# Patient Record
Sex: Female | Born: 1975 | Race: Black or African American | Hispanic: No | Marital: Single | Smoking: Never smoker
Health system: Western US, Academic
[De-identification: ages and names within clinical notes are randomized; demographics above are authoritative.]

## PROBLEM LIST (undated history)

## (undated) ENCOUNTER — Ambulatory Visit (HOSPITAL_BASED_OUTPATIENT_CLINIC_OR_DEPARTMENT_OTHER): Payer: Self-pay | Admitting: Gastroenterology

## (undated) ENCOUNTER — Encounter (HOSPITAL_BASED_OUTPATIENT_CLINIC_OR_DEPARTMENT_OTHER): Payer: Self-pay

## (undated) DIAGNOSIS — I10 Essential (primary) hypertension: Secondary | ICD-10-CM

## (undated) DIAGNOSIS — R002 Palpitations: Secondary | ICD-10-CM

## (undated) DIAGNOSIS — J45909 Unspecified asthma, uncomplicated: Secondary | ICD-10-CM

## (undated) HISTORY — DX: Unspecified asthma, uncomplicated: J45.909

## (undated) HISTORY — DX: Palpitations: R00.2

## (undated) HISTORY — DX: Essential (primary) hypertension: I10

## (undated) SURGERY — COLONOSCOPY
Anesthesia: Monitored Anesthesia Care (MAC)

## (undated) MED ORDER — LIDOCAINE HCL (PF) 1 % IJ SOLN
0.50 mL | Freq: Once | INTRAMUSCULAR | Status: AC | PRN
Start: 2021-03-12 — End: 2021-03-12

## (undated) MED ORDER — GOSERELIN ACETATE 3.6 MG SC IMPL
3.60 mg | DRUG_IMPLANT | Freq: Once | SUBCUTANEOUS | Status: AC
Start: 2021-03-12 — End: 2021-03-12

## (undated) MED ORDER — TRAMADOL HCL 50 MG OR TABS
ORAL_TABLET | ORAL | Status: AC
Start: 2020-12-28 — End: ?

---

## 2020-08-28 ENCOUNTER — Other Ambulatory Visit: Payer: Self-pay | Admitting: Specialist

## 2020-08-28 DIAGNOSIS — R928 Other abnormal and inconclusive findings on diagnostic imaging of breast: Secondary | ICD-10-CM

## 2020-09-01 ENCOUNTER — Encounter (HOSPITAL_BASED_OUTPATIENT_CLINIC_OR_DEPARTMENT_OTHER): Payer: Self-pay

## 2020-09-01 ENCOUNTER — Telehealth (HOSPITAL_BASED_OUTPATIENT_CLINIC_OR_DEPARTMENT_OTHER): Payer: Self-pay

## 2020-09-01 DIAGNOSIS — C50912 Malignant neoplasm of unspecified site of left female breast: Secondary | ICD-10-CM

## 2020-09-01 NOTE — Telephone Encounter (Signed)
Received external referral :      Dx : Breast Cancer    All imaging and path included -- need images and slides     Referred by [facility] - Dr. Adella Hare  Insurance: [facility] - Medi-Cal      Will fwd to AA's for review

## 2020-09-01 NOTE — Telephone Encounter (Signed)
Imaging and path slides are requested through [facility].  Path report is requested from [facility].  Patient will be contacted to schedule consultation with Dr. Benjie Karvonen.

## 2020-09-04 NOTE — Telephone Encounter (Signed)
Patient is contacted imaging is discussed, MRI is scheduled for 8/29 at [facility] patient tried to schedule at [place] but the wait was to long.  Patient will bring her imaging on CD to consultation scheduled with Dr. Benjie Karvonen on 9/8.  Forwarding to Dr. Benjie Karvonen for left breast cancer imaging and to [place] for order to review path.

## 2020-09-04 NOTE — Telephone Encounter (Signed)
Pathology review of outside slides ordered per protocol.

## 2020-09-07 NOTE — Addendum Note (Signed)
Addended by: Tenna Delaine on: 09/07/2020 10:48 AM     Modules accepted: Orders

## 2020-09-16 NOTE — Interdisciplinary (Signed)
New Patient Consultation with Dr. Benjie Karvonen scheduled 09/17/20 for [facility]     Diagnosis   Left Breast ILC    ER/PR/HER2 Status: ER/PR +, HER2 -, Ki-67 11%    Pathology  07/23/20 ([facility])      Imaging  06/25/20 Dx Mammo and U/S ([place])        Nursing Assessment  Pt arrives alert and orientated, accompanied by daughter  Patient reports the following breast symptoms: felt lump in June     History  Menarche: 12  Menopause: Lmp 09/03/20  G5P3 2 AB-- age at first birth: 20  HRT/OCP: OCP x 10 yrs  Family History of Breast [place]: denies  Family History of Ovarian YY:TKPTWS  Genetic testing: denies  Ashkenazi Jewish heritage: denies  Prior breast biopsies/abnormal imaging (laterality and date):     Family History of Cancer:   No family history on file.    Medical History   Past Medical History:   Diagnosis Date   . Asthma    . Hypertension         Surgical History   No past surgical history on file.    Social history  Social History     Socioeconomic History   . Marital status: Single   Tobacco Use   . Smoking status: Never Smoker   . Smokeless tobacco: Never Used   Substance and Sexual Activity   . Alcohol use: Yes     Alcohol/week: 2.0 standard drinks     Types: 2 Glasses of wine per week     Comment: once a week   . Drug use: Never       Home life: lives with fiance and son  Transportation: pt drives, daughter can provide transportation  Employment: caregiver    Medications and Allergies:   Reviewed in [place], U/S and Breast MRI   - labs    Return to clinic with Dr. Benjie Karvonen in 2 weeks    Patient provided with AVS, which was reviewed thoroughly prior to discharge.

## 2020-09-16 NOTE — Patient Instructions (Signed)
[facility]  PHONE LIST FOR PATIENTS    Hours of operation:  Monday-Friday 8:00 - 4:30pm. Closed Holidays and Weekends    AFTER HOURS EMERGENCY NUMBER:    For urgent questions after hours, or on weekends/holidays, contact the [place] paging operator at [phone], and ask to speak to the doctor on call for Dr. Sarah Blair. Ask for On-Call Medical Oncologist for medical symptoms    [facility] front desk at: [phone]    For surgery scheduling, insurance or disability questions, please call,  Administrative Assistant Lori Morgan:Phone: [phone], Fax: [phone]    For nursing questions during weekdays, please call:   Jaquelynn Wanamaker , RN, BSN, Nurse Case Manager at [phone]    Social Worker:   Brooke Mullineaux, LCSW - Phone: [phone] or Laurie Knight, BS, MSW, LCSW  - Phone: [phone]    [facility] Useful Phone Numbers     Imaging Scheduling Number: [phone]     Nuclear Medicine Bone Scan: [phone]     Scheduling Line (medical oncology, plastic surgery and breast surgery): [phone]     [place] [facility]: [phone]     [place] [facility] On[facility]: [phone] or [phone]    [place] [facility] On[facility]: [phone]     [place] [facility] On[facility]: [phone]      Occupational Therapy: [phone]     Gynecology Oncology: [phone]     Family Center Genetics: [phone]     Plastic Surgery: [phone]    Information Desk: [phone]    General information, directions, phone numbers, registration and other available services     Financial Counselors: [phone] at [facility] [phone] or [phone]   Marisela, MCC Financial Counselor: [phone] [email or web address]    Insurance Contracting: [phone]   MED CENTER Billing: [phone]   MED GROUP (Physician) Billing: [phone]       Helpful Resources      Patient and Family Resource Center - Open daily; hours vary   Location: [facility], Room 1066, on the first floor just to the left of the lobby elevators   Phone [phone]     Kaiser's support groups (open to any patients in [place] area:) http://[email or web address]_Home.[email or web address]      American Cancer Society Breast [place] support groups: https://www.cancer.org/treatment/support-programs-and-services.html       Stage IV Zoom Breast Cancer Support group meets twice monthly. Sign up with the following link: [email or web address]?trumbaEmbed=filterview%3Dcancerdefault%26template%3Dlist     Use this link to learn more about breast cancer as a diagnosis.   http://www.losolivos-obgyn.com/info/general_health/breast_care/guide_breast_ca_dx_tx.pdf     Use this link to learn more about advance directives.  https://prepareforyourcare.org/content/default/common/documents/PREPARE-Pamphlet-Flat-English.pdf       Tell Us How We Did During Your Consultation/Follow Up Visit...     It is our priority to make sure that we are providing excellent patient care.    Your feedback goes a long way and makes a difference. If you would like to provide us feedback on how we did, you can contact the Patient Experience 'We Listen' Department via telephone, Email, or by mail. Below you will find the contact information for Patient Experience 'We Listen'     E-mail: [email or web address]   Phone: [phone]    Mail:   Patient Experience   [facility]    [address], Mail [address]   [place] [address]        Thank you for the opportunity to care for you during this time.

## 2020-09-17 ENCOUNTER — Ambulatory Visit: Payer: Medicaid Other | Attending: Surgical Oncology | Admitting: Surgical Oncology

## 2020-09-17 ENCOUNTER — Other Ambulatory Visit (INDEPENDENT_AMBULATORY_CARE_PROVIDER_SITE_OTHER): Payer: Medicaid Other | Attending: Surgical Oncology

## 2020-09-17 ENCOUNTER — Encounter (HOSPITAL_BASED_OUTPATIENT_CLINIC_OR_DEPARTMENT_OTHER): Payer: Self-pay | Admitting: Surgical Oncology

## 2020-09-17 VITALS — BP 122/80 | HR 98 | Temp 97.2°F | Resp 16 | Ht 67.0 in | Wt 159.6 lb

## 2020-09-17 DIAGNOSIS — Z170 Estrogen receptor positive status [ER+]: Secondary | ICD-10-CM | POA: Insufficient documentation

## 2020-09-17 DIAGNOSIS — C50412 Malignant neoplasm of upper-outer quadrant of left female breast: Secondary | ICD-10-CM | POA: Insufficient documentation

## 2020-09-17 LAB — CBC WITH DIFF, BLOOD
ANC-Automated: 5.2 10*3/uL (ref 1.6–7.0)
ANC-Instrument: 5.2 10*3/uL (ref 1.6–7.0)
Abs Basophils: 0.1 10*3/uL (ref <0.2)
Abs Eosinophils: 0.5 10*3/uL (ref 0.0–0.5)
Abs Lymphs: 3.7 10*3/uL — ABNORMAL HIGH (ref 0.8–3.1)
Abs Monos: 1.3 10*3/uL — ABNORMAL HIGH (ref 0.2–0.8)
Basophils: 1 %
Eosinophils: 5 %
Hct: 40.8 % (ref 34.0–45.0)
Hgb: 14.4 gm/dL (ref 11.2–15.7)
Lymphocytes: 34 %
MCH: 33.9 pg — ABNORMAL HIGH (ref 26.0–32.0)
MCHC: 35.3 g/dL (ref 32.0–36.0)
MCV: 96 um3 — ABNORMAL HIGH (ref 79.0–95.0)
MPV: 9.7 fL (ref 9.4–12.4)
Monocytes: 12 %
Plt Count: 327 10*3/uL (ref 140–370)
RBC: 4.25 10*6/uL (ref 3.90–5.20)
RDW: 12.2 % (ref 12.0–14.0)
Segs: 47 %
WBC: 10.9 10*3/uL — ABNORMAL HIGH (ref 4.0–10.0)

## 2020-09-17 LAB — BASIC METABOLIC PANEL, BLOOD
Anion Gap: 13 mmol/L (ref 7–15)
BUN: 14 mg/dL (ref 6–20)
Bicarbonate: 28 mmol/L (ref 22–29)
Calcium: 9.7 mg/dL (ref 8.5–10.6)
Chloride: 99 mmol/L (ref 98–107)
Creatinine: 0.88 mg/dL (ref 0.51–0.95)
GFR: >60 mL/min
Glucose: 91 mg/dL (ref 70–99)
Potassium: 3.5 mmol/L (ref 3.5–5.1)
Sodium: 140 mmol/L (ref 136–145)
eGFR Based on CKD-EPI 2021 Equation: >60 mL/min

## 2020-09-17 LAB — RBC MORPHOLOGY: Plt Est: ADEQUATE

## 2020-09-17 MED ORDER — HYDROCHLOROTHIAZIDE 25 MG OR TABS: 25.0000 mg | ORAL_TABLET | Freq: Every day | ORAL | Status: AC

## 2020-09-17 MED ORDER — MONTELUKAST SODIUM 10 MG OR TABS
ORAL_TABLET | Freq: Every evening | ORAL | Status: AC
Start: 2020-08-15 — End: ?

## 2020-09-17 MED ORDER — ALBUTEROL SULFATE 108 (90 BASE) MCG/ACT IN AERS
INHALATION_SPRAY | RESPIRATORY_TRACT | Status: AC
Start: 2020-08-18 — End: ?

## 2020-09-17 NOTE — Goals of Care (Signed)
Advance Care Planning    What gives the patient's life meaning?      Patient would be willing to endure aggressive medical therapies as long as they could still:       Who would make medical decisions for the patient if they are unable to make decisions for themselves?   Shari Prows daughter [phone]    Based on above information I recommended the following:  completion of an advance directive and reviewing www.prepareforyourcare.com    Total time spent face-to-face with patient and/or surrogate decision maker providing counseling related to advance care planning:   2 minutes

## 2020-09-17 NOTE — Progress Notes (Signed)
Surgical Oncology:        Demographics:  Date: September 17, 2020   Patient Name: Kelsey Rubio   Medical Record #: [id number]   DOB: [birth year]  Age: 45 year old  Sex: female    Interval History:    Kelsey Rubio is 45 year old female  with T2N0 ILC.  She presented with a palpable mass.  Imaging showed T2 mass core bx ILC.  Last mammogram 2 yrs ago was normal.  No fhx breast [place].  She declined genetic testing    ER/PR/HER2 Status: ER/PR +, HER2 -, Ki-67 11%    Pathology  07/23/20 ([facility])      Imaging  06/25/20 Dx Mammo and U/S ([place])          History  Menarche: 12  Menopause: Lmp 09/03/20  G5P3 2 AB-- age at first birth: 66  HRT/OCP: OCP x 10 yrs  Family History of Breast [place]: denies  Family History of Ovarian QQ:PYPPJK  Genetic testing: denies  Ashkenazi Jewish heritage: denies  Prior breast biopsies/abnormal imaging (laterality and date):         Surgical Oncologic History:    Stage at first diagnosis: lt T2N0 9/22  Surgical Procedure: p  Chemotherapy: p  Radiation: p    REVIEW OF SYSTEMS   GEN: The patient has no weight loss, fevers, or chills  EYES:No change in vision.  ENT: No change in hearing. No epistaxis.   PULM: No dypnea, productive cough, or wheezing  CARDIO:  No chest pain, tachycardias, dyspnea on exertion.  GI: No jaundice, hematesesis, abdominal pain, diarrhea or constipation.  GU: No dysuria or hematuria  ENDO: No diabetes.  JOINT:  No new joint pains.  HEME/LYMPHATIC: No bleeding, anemias, or lymphadenopathy.  NEURO: No loss of consciousness, headaches.    Medications:  Current Outpatient Medications   Medication Sig    albuterol 108 (90 Base) MCG/ACT inhaler INHALE 2 PUFFS BY MOUTH EVERY 4 TO 6 HOURS AS NEEDED    hydrochlorothiazide (HYDRODIURIL) 25 MG tablet Take 25 mg by mouth daily.    montelukast (SINGULAIR) 10 MG tablet TAKE 1 TABLET BY MOUTH EVERY DAY IN THE EVENING     No current facility-administered medications for this visit.        Allergies:  Allergies   Allergen Reactions    Aspirin Other     Wheezing and stuffy nose    Ibuprofen Other     Wheezing and stuffy nose         Physical Exam:  BP 122/80    Pulse 98    Temp 97.2 F (36.2 C) (Temporal)    Resp 16    Ht '5\' 7"'  (1.702 m)    Wt 72.4 kg (159 lb 9.8 oz)    SpO2 96%    BMI 25.00 kg/m    General Appearance: The patient is an alert, cooperative female in no acute distress.  SKIN: No lesions  HEENT: PERRLA, EOMs are intact.  The orophynx is clear.   LYMPH NODES: No palpable supra clavicular nodes.  CHEST: The lungs are clear to auscultation.  BREAST: large breast bilateral rt breast no masses no ln, lt breast 2cm mass UOQ no ln palp  HEART: S1 and S2 are normal.  There are no murmurs or extrasounds.  ABDOMEN:  Liver and spleen are not palpable.  EXTREMITES: There is no edema or cyanosis. Joints are normal without redness or swelling.  NEURO:  Mental status is normal.  No focal neurologic findings.    Assessment/Plan:  In summary this patient is a 45 year old female with T2N0 ILC  1. Imaging at [facility] including breast mri for extent of disease  2. Pt is thinking about breast reduction  3. F/u after imaging for surgical planning    Greater than 60 minutes was spent on this encounter with >80% of the visit in face to face counseling, reviewing imaging, history, and exam as well as surgical planning and complex care coordination. Diagnoses, natural course, treatments and risks were discussed with the patient. Patient verbalizes understanding and is agreeable to above plan.

## 2020-09-18 ENCOUNTER — Other Ambulatory Visit
Admission: RE | Admit: 2020-09-18 | Discharge: 2020-09-18 | Disposition: A | Discharge status: Home / Self Care | Payer: Medicaid Other | Attending: Anatomic Pathology & Clinical Pathology | Admitting: Anatomic Pathology & Clinical Pathology

## 2020-09-18 DIAGNOSIS — Z170 Estrogen receptor positive status [ER+]: Secondary | ICD-10-CM

## 2020-09-18 DIAGNOSIS — C50912 Malignant neoplasm of unspecified site of left female breast: Secondary | ICD-10-CM | POA: Insufficient documentation

## 2020-09-22 ENCOUNTER — Other Ambulatory Visit: Payer: Self-pay

## 2020-09-23 ENCOUNTER — Telehealth (HOSPITAL_BASED_OUTPATIENT_CLINIC_OR_DEPARTMENT_OTHER): Payer: Self-pay | Admitting: Surgical Oncology

## 2020-09-23 NOTE — Telephone Encounter (Signed)
Patients fiance came to the front desk to drop off cd's for Dr.Blair. Patient has an appointment next week so she will pick cd's then. I will place cd's on [name]'s desk.

## 2020-09-28 NOTE — Patient Instructions (Signed)
[facility]  PHONE LIST FOR PATIENTS    Hours of operation:  Monday-Friday 8:00 - 4:30pm. Closed Holidays and Weekends    AFTER HOURS EMERGENCY NUMBER:    For urgent questions after hours, or on weekends/holidays, contact the [place] paging operator at [phone], and ask to speak to the doctor on call for Dr. Sarah Blair. Ask for On-Call Medical Oncologist for medical symptoms    [facility] front desk at: [phone]    For surgery scheduling, insurance or disability questions, please call,  Administrative Assistant Lori Morgan:Phone: [phone], Fax: [phone]    For nursing questions during weekdays, please call:   [name] Gudoy , RN, BSN, Nurse Case Manager at [phone]    Social Worker:   Brooke Mullineaux, LCSW - Phone: [phone] or Laurie Knight, BS, MSW, LCSW  - Phone: [phone]    [facility] Useful Phone Numbers     Imaging Scheduling Number: [phone]     Nuclear Medicine Bone Scan: [phone]     Scheduling Line (medical oncology, plastic surgery and breast surgery): [phone]     [place] [facility]: [phone]     [place] [facility] On[facility]: [phone] or [phone]    [place] [facility] Oncology: [phone]     [place] [facility]: [phone]      Occupational Therapy: [phone]     Gynecology Oncology: [phone]     Family Center Genetics: [phone]     Plastic Surgery: [phone]    Information Desk: [phone]    General information, directions, phone numbers, registration and other available services     Financial Counselors: [phone] at [facility] [phone] or [phone]   Marisela, MCC Financial Counselor: [phone] [email or web address]    Insurance Contracting: [phone]   MED CENTER Billing: [phone]   MED GROUP (Physician) Billing: [phone]       Helpful Resources      Patient and Family Resource Center - Open daily; hours vary   Location:  Cancer Center, Room 1066, on the first floor just to the left of the lobby elevators   Phone [phone]     Kaiser's support groups (open to any patients in [place] area:) http://[email or web address]/Support_Home.html      American Cancer Society Breast [place] support groups: https://www.cancer.org/treatment/support-programs-and-services.html       Stage IV Zoom Breast Cancer Support group meets twice monthly. Sign up with the following link: Https://[email or web address]/patients/[email or web address]/calendar/Pages/default.aspx?trumbaEmbed=filterview%3Dcancerdefault%26template%3Dlist     Use this link to learn more about breast cancer as a diagnosis.   http://www.losolivos-obgyn.com/info/general_health/breast_care/guide_breast_ca_dx_tx.pdf     Use this link to learn more about advance directives.  https://prepareforyourcare.org/content/default/common/documents/PREPARE-Pamphlet-Flat-English.pdf       Tell Us How We Did During Your Consultation/Follow Up Visit...     It is our priority to make sure that we are providing excellent patient care.    Your feedback goes a long way and makes a difference. If you would like to provide us feedback on how we did, you can contact the Patient Experience 'We Listen' Department via telephone, Email, or by mail. Below you will find the contact information for Patient Experience 'We Listen'     E-mail: [email or web address]   Phone: [phone]    Mail:   Patient Experience   [facility]    [address], Mail Code 8916    [address]        Thank you for the opportunity to care for you during this time.

## 2020-09-28 NOTE — Interdisciplinary (Deleted)
45 year old patient with breast cancer here for surgical planning with Dr Benjie Karvonen    Pre-op diagnosis  Left Breast ILC    Neoadjuvant treatment  *** Last dose:    Pre-op imaging   Mammo/US: ***  MRI: ***    Nursing assessment  Patient ***unaccompanied by***  Alert and oriented x 4    Wellbeing Assessment :   Transportation issue: None  Barriers to learning: None.  Emotional support given     Interventions:  Supportive care given. Personal difficulties related to treatment addressed to patient's satisfaction. Patient/family concerns addressed.     Education:  Contact information, AVS given and reviewed. Questions asked and answered to patient's satisfaction and understanding      Plan  -Pt. Consented for ***  -surgery as scheduled  -post-op as scheduled

## 2020-09-30 ENCOUNTER — Inpatient Hospital Stay (INDEPENDENT_AMBULATORY_CARE_PROVIDER_SITE_OTHER): Admit: 2020-09-30 | Discharge: 2020-09-30 | Disposition: A | Discharge status: Home Health | Payer: Medicaid Other

## 2020-09-30 ENCOUNTER — Telehealth (HOSPITAL_BASED_OUTPATIENT_CLINIC_OR_DEPARTMENT_OTHER): Payer: Self-pay

## 2020-09-30 DIAGNOSIS — R928 Other abnormal and inconclusive findings on diagnostic imaging of breast: Secondary | ICD-10-CM

## 2020-09-30 MED ORDER — GADOBUTROL 1 MMOL/ML IV SOLN (WRAPPED RECORD)
7.0000 mL | Freq: Once | INTRAVENOUS | Status: AC
Start: 2020-09-30 — End: 2020-09-30
  Administered 2020-09-30 (×2): 7 mL via INTRAVENOUS

## 2020-09-30 NOTE — Telephone Encounter (Signed)
Spoke with pt and let her know that she still needs to get a repeat mammo and US done before her f/u visit with Dr Benjie Karvonen and that these imaging's were scheduled on 10/07/20 at 10:20 am, checkin 15 min prior at [facility]. Also informed her will reschedule her 9/22 appt, she is able to come in on Thursday 9/29 at 11 am.

## 2020-10-01 ENCOUNTER — Ambulatory Visit (HOSPITAL_BASED_OUTPATIENT_CLINIC_OR_DEPARTMENT_OTHER): Payer: Medicaid Other | Admitting: Surgical Oncology

## 2020-10-01 NOTE — Interdisciplinary (Signed)
45 year old patient with breast cancer here for surgical planning with Dr Benjie Karvonen    Pre-op diagnosis  Left Breast ILC    Neoadjuvant treatment   Last dose:    Pre-op imaging   Mammo/US: 10/07/20  DIAGNOSTIC MAMMOGRAM WITH DIGITAL BREAST TOMOSYNTHESIS FINDINGS:  The breasts are heterogeneously dense, which may obscure small masses.    In the right breast there are no masses, asymmetries, or suspicious calcifications.    There is an irregular mass with spiculated margins and associated marker clip in the left breast at 2 o'clock located 9 centimeters from the nipple.    ULTRASOUND FINDINGS:  Sonography was performed in the left breast in transverse and longitudinal planes. Ultrasound demonstrates an irregular mass measuring 2.0 x 1.3 x 2.0 cm in the left breast at 2 o'clock located 9 centimeters from the nipple.  The mass is poorly delineated on mammography and ultrasound, and is known to be substantially larger than these ultrasound measurements based on 09/30/2020 MRI.    No axillary lymphadenopathy identified.    IMPRESSION / RECOMMENDATION:  There is no evidence of malignancy in the right breast.    Mass in the left breast is a known malignancy.    Clinical management of known malignancy is recommended.    ASSESSMENT:  BI-RADS Category 6:  Known Biopsy Proven Malignancy    MRI: 09/30/20  FINDINGS:  The technical quality of this study is considered adequate to make a final assessment and recommendation.    There is heterogeneous fibroglandular tissue bilaterally and moderate background enhancement.    Coronal T2 sequences demonstrates normal bilateral axillary nodes.    Axial T2 sequence demonstrates no large cysts or fluid collections.    Non fat saturated T1 sequence demonstrates no fat containing lesions. Signal void in the left breast at site of biopsy marking clip seen on mammogram.    High resolution post contrast sequence demonstrates no internal mammary lymphadenopathy.    Regarding the right  breast: There are no masses, architectural distortion or suspicious areas of enhancement.    Regarding the left breast: Known invasive lobular carcinoma in the left breast is seen as an irregular mass with spiculated and associated focal non-mass enhancement at the 2 o'clock position, 9 cm from the nipple measuring 6.2 x 3.7 x 2.3 cm (post contrast dynamic image 102). Internal enhancement is heterogeneous. Kinetic assessment demonstrates fast initial enhancement followed by washout in the delayed portions of the curve.    CONCURRENT SUPERVISION:   I have reviewed the images and agree with the fellow's interpretation.     Preliminary created by: Teressa Lower   Signed by: Lynnda Child 09/30/2020 16:10:36  IMPRESSION:  IMPRESSION:  1.Known invasive lobular carcinoma in the left breast at 2:00 measures 6.2 x 3.7 x 2.3 cm.    2. No lymphadenopathy.    Recommendations: Diagnostic mammogram and left breast ultrasound are scheduled for 10/07/2020.    BI-RADS category 1, negative right breast.    BI-RADS category 6, known biopsy proven malignancy left breast.      Nursing assessment  Patient unaccompanied  Alert and oriented x 4    Wellbeing Assessment :   Transportation issue: None  Barriers to learning: None.  Emotional support given     Interventions:  Supportive care given. Personal difficulties related to treatment addressed to patient's satisfaction. Patient/family concerns addressed.     Education:  Contact information, AVS given and reviewed. Questions asked and answered to patient's satisfaction and understanding  Plan  -Pt. Consented for Left Breast Image Guided Lumpectomy and Sentinel Lymph Node Biopsy by NCM [name]    -surgery as scheduled  -post-op as scheduled

## 2020-10-01 NOTE — Patient Instructions (Signed)
[facility]  PHONE LIST FOR PATIENTS    Hours of operation:  Monday-Friday 8:00 - 4:30pm. Closed Holidays and Weekends    AFTER HOURS EMERGENCY NUMBER:    For urgent questions after hours, or on weekends/holidays, contact the [place] paging operator at [phone], and ask to speak to the doctor on call for Dr. Sarah Blair. Ask for On-Call Medical Oncologist for medical symptoms    [facility] front desk at: [phone]    For surgery scheduling, insurance or disability questions, please call,  Administrative Assistant Lori Morgan:Phone: [phone], Fax: [phone]    For nursing questions during weekdays, please call:   [name] Gudoy , RN, BSN, Nurse Case Manager at [phone]    Social Worker:   Brooke Mullineaux, LCSW - Phone: [phone] or Laurie Knight, BS, MSW, LCSW  - Phone: [phone]    [facility] Useful Phone Numbers     Imaging Scheduling Number: [phone]     Nuclear Medicine Bone Scan: [phone]     Scheduling Line (medical oncology, plastic surgery and breast surgery): [phone]     [place] [facility]: [phone]     [place] [facility]: [phone] or [phone]    [place] [facility] On[facility]: [phone]     [place] [facility] On[facility]: [phone]      Occupational Therapy: [phone]     Gynecology Oncology: [phone]     Family Center Genetics: [phone]     Plastic Surgery: [phone]    Information Desk: [phone]    General information, directions, phone numbers, registration and other available services     Financial Counselors: [phone] at [facility] [phone] or [phone]   Marisela, MCC Financial Counselor: [phone] [email or web address]    Insurance Contracting: [phone]   MED CENTER Billing: [phone]   MED GROUP (Physician) Billing: [phone]       Helpful Resources      Patient and Family Resource Center - Open daily; hours vary   Location: [facility], Room 1066, on the first floor just to the left of the lobby elevators   Phone [phone]     Kaiser's support groups (open to any patients in [place] area:) http://[email or web address]-[place].[email or web address]/Support_Home.html      American Cancer Society Breast [place] support groups: https://www.cancer.org/treatment/support-programs-and-services.html       Stage IV Zoom Breast Cancer Support group meets twice monthly. Sign up with the following link: [email or web address]://[email or web address]/Pages/default.aspx?trumbaEmbed=filterview%3Dcancerdefault%26template%3Dlist     Use this link to learn more about breast cancer as a diagnosis.   http://www.losolivos-obgyn.com/info/general_health/breast_care/guide_breast_ca_dx_tx.pdf     Use this link to learn more about advance directives.  https://prepareforyourcare.org/content/default/common/documents/PREPARE-Pamphlet-Flat-English.pdf       Tell Us How We Did During Your Consultation/Follow Up Visit...     It is our priority to make sure that we are providing excellent patient care.    Your feedback goes a long way and makes a difference. If you would like to provide us feedback on how we did, you can contact the Patient Experience 'We Listen' Department via telephone, Email, or by mail. Below you will find the contact information for Patient Experience 'We Listen'     E-mail: [email or web address]   Phone: [phone]    Mail:   Patient Experience   [facility]    [address], Mail [address] 8916    [address]        Thank you for the opportunity to care for you during this time.

## 2020-10-02 LAB — FISH: HER-2NEU AMPLIFICATION ASSAY

## 2020-10-07 ENCOUNTER — Ambulatory Visit (HOSPITAL_BASED_OUTPATIENT_CLINIC_OR_DEPARTMENT_OTHER)
Admit: 2020-10-07 | Discharge: 2020-10-07 | Disposition: A | Discharge status: Home Health | Payer: Medicaid Other | Attending: Surgical Oncology | Admitting: Surgical Oncology

## 2020-10-07 ENCOUNTER — Ambulatory Visit
Admission: RE | Admit: 2020-10-07 | Discharge: 2020-10-07 | Disposition: A | Discharge status: Home / Self Care | Payer: Medicaid Other | Attending: Surgical Oncology | Admitting: Surgical Oncology

## 2020-10-07 DIAGNOSIS — C50412 Malignant neoplasm of upper-outer quadrant of left female breast: Secondary | ICD-10-CM

## 2020-10-07 DIAGNOSIS — Z170 Estrogen receptor positive status [ER+]: Secondary | ICD-10-CM

## 2020-10-07 DIAGNOSIS — C50912 Malignant neoplasm of unspecified site of left female breast: Secondary | ICD-10-CM

## 2020-10-07 DIAGNOSIS — Z978 Presence of other specified devices: Secondary | ICD-10-CM

## 2020-10-08 ENCOUNTER — Ambulatory Visit: Payer: Medicaid Other | Attending: Surgical Oncology | Admitting: Surgical Oncology

## 2020-10-08 ENCOUNTER — Other Ambulatory Visit (HOSPITAL_BASED_OUTPATIENT_CLINIC_OR_DEPARTMENT_OTHER): Payer: Self-pay | Admitting: Surgical Oncology

## 2020-10-08 ENCOUNTER — Encounter (HOSPITAL_BASED_OUTPATIENT_CLINIC_OR_DEPARTMENT_OTHER): Payer: Self-pay | Admitting: Surgical Oncology

## 2020-10-08 VITALS — BP 143/88 | HR 76 | Temp 96.8°F | Resp 18 | Ht 67.0 in | Wt 164.7 lb

## 2020-10-08 DIAGNOSIS — C50412 Malignant neoplasm of upper-outer quadrant of left female breast: Secondary | ICD-10-CM

## 2020-10-08 DIAGNOSIS — C50919 Malignant neoplasm of unspecified site of unspecified female breast: Secondary | ICD-10-CM

## 2020-10-08 DIAGNOSIS — Z170 Estrogen receptor positive status [ER+]: Secondary | ICD-10-CM

## 2020-10-08 DIAGNOSIS — R928 Other abnormal and inconclusive findings on diagnostic imaging of breast: Secondary | ICD-10-CM

## 2020-10-08 LAB — EMMI , LUMPECTOMY FOR MALIGNANCY: EMMI Video Order Number: 18098216403

## 2020-10-08 NOTE — Interdisciplinary (Signed)
45 year old patient with breast cancer here for surgical planning with Dr Benjie Karvonen    Pre-op diagnosis  Left Breast ILC    Neoadjuvant treatment   Last dose:    Pre-op imaging   Mammo/US: 10/07/20  DIAGNOSTIC MAMMOGRAM WITH DIGITAL BREAST TOMOSYNTHESIS FINDINGS:  The breasts are heterogeneously dense, which may obscure small masses.    In the right breast there are no masses, asymmetries, or suspicious calcifications.    There is an irregular mass with spiculated margins and associated marker clip in the left breast at 2 o'clock located 9 centimeters from the nipple.    ULTRASOUND FINDINGS:  Sonography was performed in the left breast in transverse and longitudinal planes. Ultrasound demonstrates an irregular mass measuring 2.0 x 1.3 x 2.0 cm in the left breast at 2 o'clock located 9 centimeters from the nipple.  The mass is poorly delineated on mammography and ultrasound, and is known to be substantially larger than these ultrasound measurements based on 09/30/2020 MRI.    No axillary lymphadenopathy identified.    IMPRESSION / RECOMMENDATION:  There is no evidence of malignancy in the right breast.    Mass in the left breast is a known malignancy.    Clinical management of known malignancy is recommended.    ASSESSMENT:  BI-RADS Category 6:  Known Biopsy Proven Malignancy    MRI: 09/30/20  FINDINGS:  The technical quality of this study is considered adequate to make a final assessment and recommendation.    There is heterogeneous fibroglandular tissue bilaterally and moderate background enhancement.    Coronal T2 sequences demonstrates normal bilateral axillary nodes.    Axial T2 sequence demonstrates no large cysts or fluid collections.    Non fat saturated T1 sequence demonstrates no fat containing lesions. Signal void in the left breast at site of biopsy marking clip seen on mammogram.    High resolution post contrast sequence demonstrates no internal mammary lymphadenopathy.    Regarding the right  breast: There are no masses, architectural distortion or suspicious areas of enhancement.    Regarding the left breast: Known invasive lobular carcinoma in the left breast is seen as an irregular mass with spiculated and associated focal non-mass enhancement at the 2 o'clock position, 9 cm from the nipple measuring 6.2 x 3.7 x 2.3 cm (post contrast dynamic image 102). Internal enhancement is heterogeneous. Kinetic assessment demonstrates fast initial enhancement followed by washout in the delayed portions of the curve.    CONCURRENT SUPERVISION:   I have reviewed the images and agree with the fellow's interpretation.     Preliminary created by: Teressa Lower   Signed by: Lynnda Child 09/30/2020 16:10:36  IMPRESSION:  IMPRESSION:  1.Known invasive lobular carcinoma in the left breast at 2:00 measures 6.2 x 3.7 x 2.3 cm.    2. No lymphadenopathy.    Recommendations: Diagnostic mammogram and left breast ultrasound are scheduled for 10/07/2020.    BI-RADS category 1, negative right breast.    BI-RADS category 6, known biopsy proven malignancy left breast.      Nursing assessment  Patient seen and assessed by LVN Claudia    Wellbeing Assessment :   Transportation issue: None  Barriers to learning: None.  Emotional support given     Interventions:  Supportive care given. Personal difficulties related to treatment addressed to patient's satisfaction. Patient/family concerns addressed.     Education:  Contact information, AVS given and reviewed. Questions asked and answered to patient's satisfaction and understanding  Plan  -Pt. Consented for Left Breast Image Guided Lumpectomy and Sentinel Lymph Node Biopsy by  -surgery as scheduled  -post-op as scheduled  -surgical instructions given  - needs MR guided localization

## 2020-10-08 NOTE — Progress Notes (Signed)
Surgical Oncology:        Demographics:  Date: October 08, 2020   Patient Name: Kelsey Rubio   Medical Record #: [id number]   DOB: [birth year]  Age: 45 year old  Sex: female    Interval History:    Kelsey Rubio is 45 year old female  with T2N0 ILC.  She presented with a palpable mass.  Imaging showed T2 mass core bx ILC.  Last mammogram 2 yrs ago was normal.  No fhx breast Ideal.  She declined genetic testing.  Breast mri shows 6cm disease    Breast mri  IMPRESSION:  1.Known invasive lobular carcinoma in the left breast at 2:00 measures 6.2 x 3.7 x 2.3 cm.    2. No lymphadenopathy.    Recommendations: Diagnostic mammogram and left breast ultrasound are scheduled for 10/07/2020.    BI-RADS category 1, negative right breast.    BI-RADS category 6, known biopsy proven malignancy left breast.      ER/PR/HER2 Status: ER/PR +, HER2 -, Ki-67 11%    [facility]  [facility]      Imaging  06/25/20 Dx Mammo and U/S ([place])          History  Menarche: 12  Menopause: Lmp 09/03/20  G5P3 2 AB-- age at first birth: 39  HRT/OCP: OCP x 10 yrs  Family History of Breast Melvina: denies  Family History of Ovarian NK:NLZJQB  Genetic testing: denies  Ashkenazi Jewish heritage: denies  Prior breast biopsies/abnormal imaging (laterality and date):         Surgical Oncologic History:    Stage at first diagnosis: lt T2N0 9/22  Surgical Procedure: p  Chemotherapy: p  Radiation: p    REVIEW OF SYSTEMS   GEN: The patient has no weight loss, fevers, or chills  EYES:No change in vision.  ENT: No change in hearing. No epistaxis.   PULM: No dypnea, productive cough, or wheezing  CARDIO:  No chest pain, tachycardias, dyspnea on exertion.  GI: No jaundice, hematesesis, abdominal pain, diarrhea or constipation.  GU: No dysuria or hematuria  ENDO: No diabetes.  JOINT:  No new joint pains.  HEME/LYMPHATIC: No bleeding, anemias, or lymphadenopathy.  NEURO: No loss of consciousness,  headaches.    Medications:  Current Outpatient Medications   Medication Sig    albuterol 108 (90 Base) MCG/ACT inhaler INHALE 2 PUFFS BY MOUTH EVERY 4 TO 6 HOURS AS NEEDED    hydrochlorothiazide (HYDRODIURIL) 25 MG tablet Take 25 mg by mouth daily.    montelukast (SINGULAIR) 10 MG tablet TAKE 1 TABLET BY MOUTH EVERY DAY IN THE EVENING     No current facility-administered medications for this visit.       Allergies:  Allergies   Allergen Reactions    Aspirin Other     Wheezing and stuffy nose    Ibuprofen Other     Wheezing and stuffy nose         Physical Exam:  BP 143/88 (BP Location: Left arm, BP Patient Position: Sitting, BP cuff size: Regular)    Pulse 76    Temp 96.8 F (36 C) (Temporal)    Resp 18    Ht '5\' 7"'  (1.702 m)    Wt 74.7 kg (164 lb 10.9 oz)    LMP 10/06/2020    SpO2 99%    BMI 25.79 kg/m    General Appearance: The patient is an alert, cooperative female in no acute distress.  SKIN: No lesions  HEENT: PERRLA, EOMs are intact.  The orophynx is clear.   LYMPH NODES: No palpable supra clavicular nodes.  CHEST: The lungs are clear to auscultation.  BREAST: large breast bilateral rt breast no masses no ln, lt breast 2cm mass UOQ no ln palp  HEART: S1 and S2 are normal.  There are no murmurs or extrasounds.  ABDOMEN:  Liver and spleen are not palpable.  EXTREMITES: There is no edema or cyanosis. Joints are normal without redness or swelling.  NEURO:  Mental status is normal.  No focal neurologic findings.    Assessment/Plan:  In summary this patient is a 45 year old female with T2N0 ILC  1. I consented her for a lt MR localized lump + sln bx.  She declined reduction mammoplasty.   I told over that there is no difference in survival if she has mastectomy vs lumpectomy plus radiation. We discussed the risks of bleeding, infection, cosmetic deformity, sensory change ,  lymphedema, missing clip. Additional we discussed  The  risks of surgery including: damage to surrounding structures, wound dehiscence,  need for repeat surgery, and associated risks of anaesthesia including myocardial infarction, stroke, deep vein thrombosis, pulmonary embolism, and death were discussed with the patient, and the patient understands these risks. We will get her scheduledI recommended sln bx.  She will get an injection of radioactive tracer and blue dye to help localize the lymph node.  We talked about the results of [id number] trial and how patients had a low local recurrence without axillary node dissection but with whole breast radiation.

## 2020-10-13 ENCOUNTER — Encounter (HOSPITAL_BASED_OUTPATIENT_CLINIC_OR_DEPARTMENT_OTHER): Payer: Self-pay | Admitting: Physician Assistant

## 2020-10-14 ENCOUNTER — Ambulatory Visit (HOSPITAL_BASED_OUTPATIENT_CLINIC_OR_DEPARTMENT_OTHER): Payer: Medicaid Other

## 2020-10-21 ENCOUNTER — Other Ambulatory Visit (HOSPITAL_BASED_OUTPATIENT_CLINIC_OR_DEPARTMENT_OTHER): Payer: Self-pay | Admitting: Surgical Oncology

## 2020-10-21 LAB — EMMI , ANESTHESIA (ADULT): EMMI Video Order Number: 14889623766

## 2020-10-21 LAB — EMMI , PATIENT SATISFACTION: HOSPITAL DISCHARGE EXPECTATIONS: EMMI Video Order Number: 12241192538

## 2020-10-21 LAB — EMMI,  PAIN MANAGEMENT: ACUTE IN HOSPITAL: EMMI Video Order Number: 11735090323

## 2020-10-26 ENCOUNTER — Other Ambulatory Visit: Payer: Self-pay

## 2020-10-27 ENCOUNTER — Ambulatory Visit (INDEPENDENT_AMBULATORY_CARE_PROVIDER_SITE_OTHER): Payer: Medicaid Other

## 2020-10-27 DIAGNOSIS — Z01818 Encounter for other preprocedural examination: Secondary | ICD-10-CM

## 2020-10-27 MED ORDER — QVAR REDIHALER IN: RESPIRATORY_TRACT | Status: AC

## 2020-10-27 NOTE — Patient Instructions (Signed)
PREOPERATIVE SURGICAL INFORMATION     Your surgery is currently scheduled at [facility] on 11/10/2020  The scheduler will be contacting you with the check in time      [facility], [place], [address] [place], [place] [address]  Please check in at Patient Services in [place] on 1st floor     [facility] (including [facility]): [employer] structure, Microbiologist structure, or Dance movement psychotherapist parking (7am-5pm at [employer]; 5am-5pm at [employer]) for same cost as self-parking in the front entrance of the [facility]   [email or web address]     QUESTIONS    If you have any questions between now and the day of your surgery, please do not hesitate to call:     [facility]: [phone]     DAY OF SURGERY ARRIVAL TIME:    On the day of your Surgery/Procedure, please arrive at the time provided by the surgeon's office. If you have any questions regarding your arrival time, please call the surgeon's office:     Preoperative Surgical Admissions at [facility]:      OK to continue to use your inhalers.    OK to take the following medications as scheduled with a small sip of water on the morning of surgery (unless advised to hold by surgeon or prescribing physician): Hydrochlorothiazide    OK to continue your niightly medications: Montelukast ( Singulair)    PLEASE HOLD ALL NSAIDS (non-steroidal anti-inflammatory drugs) SUCH AS advil, aleve, motrin, ibuprofen, relafen, lodine, feldene, Diclofenac, voltaren, indomethacin, naproxen, celebrex, Mobic 7 days before surgery.       Please HOLD vitamins, supplements, herbs & fish oil 7 days before surgery.     It is OK to take acetaminophen (Tylenol) for pain around the time of surgery unless you have liver  disease.      AFTER YOUR VISIT WITH [place], IF YOU START TAKING A NEW MEDICATION BEFORE SURGERY, PLEASE CALL us TO MAKE SURE IT IS SAFE TO TAKE & WILL NOT AFFECT YOUR SURGERY.         OSA INSTRUCTIONS:     If you use a CPAP machine, please bring the entire CPAP machine, including mask and tubing, with you on the day of surgery.         EATING/DRINKING     DO NOT EAT OR DRINK ANYTHING AFTER MIDNIGHT ON THE DAY OF SURGERY      Preparing for your Surgery:     Please wear clean loose-fitting clothes and leave valuables at home   Do not shave or remove body hair. Facial shaving is permitted. If you are having head surgery, ask your doctor whether you can shave.  Bring a picture ID and your insurance card, and be prepared to pay your deductible or co-insurance by cash, check, or credit card when you arrive.   If you are going home after your surgery, please make sure to arrange for an adult to drive you home. You CANNOT use UBER or LYFT. If you do not have a ride, your surgery may be cancelled.    Visitor policy during the WEXHB-71 pandemic is subject to change. Current visitor policy can be found at [email or web address]     On The Day of  Your Surgery:      Check in at the location mentioned above   COVID testing may be done on arrival to the Pre-op or Procedural areas according to the current CDPH mandate.   If you are a woman of child bearing age, please note that you may be asked to give a urine sample upon check-in  You will meet your anesthesia and surgery teams in the preoperative holding area before surgery.   Once surgery is over, you will wake up in the recovery room.  If you go home, an adult chaperone will need to stay with you for the first 24 hours after surgery.   Visitor policy during the CEQFD-74 pandemic is subject to change. Current visitor policy can be found at [email or web address]    A video about what to expect for the day of surgery can be  found here:    [email or web address]  Or by searching You-tube for [place] before surgery and [place] after surgery     You medical records are available to you at [email or web address]

## 2020-10-27 NOTE — Interdisciplinary (Signed)
Anesthesia Preparedness Clinic [facility]) RN PHONE CALL NOTE    Phone call to patient from [facility] RN today.     Confirmed medical history & that medications listed in Epic are accurate and up to date.     Pt denies any cardiac or pulmonary issues at this time.  No chest pain, difficulty breathing or SOB.  Pt stated exercises 3-4 days/week (walking and cardio).     Patient scheduled for surgery on 11/10/2020 at [facility].    Confirmed preoperative instructions with patient including NPO instructions.     Planning your surgery information sent to patient via mychart.    No questions or concerns at this time.

## 2020-10-30 ENCOUNTER — Encounter: Payer: Self-pay | Admitting: Family Practice

## 2020-10-30 DIAGNOSIS — Z1211 Encounter for screening for malignant neoplasm of colon: Secondary | ICD-10-CM

## 2020-11-03 ENCOUNTER — Telehealth (INDEPENDENT_AMBULATORY_CARE_PROVIDER_SITE_OTHER): Payer: Self-pay

## 2020-11-03 DIAGNOSIS — Z1211 Encounter for screening for malignant neoplasm of colon: Secondary | ICD-10-CM

## 2020-11-03 NOTE — Telephone Encounter (Signed)
External Referral    Referring Provider: Alvera Singh, MD    Requesting:   Indications: clinic consult    Urgency: Routine (Within 4 WKS or at Pt Convenience)    Appointment For: General GI      Associated Diagnoses   ICD-10-CM ICD-9-CM   Encounter for screening colonoscopy - Primary     Z12.11 V76.51     Clinic referral for colonoscopy. Colonoscopy (MAC) pended. Routing to Dr. Stephens Shire for review/approval.    Pertinent:                 10/08/2020   1206 Most Recent Value   T: 96.8 F (36 C)   HR: 76   Resp: 18   BP: 143/88   SpO2: 99 %   Ht: _0  (1.702 m)   Wt: 74.7 kg (164 lb 10.9 oz)   BMI: 25.79 kg/mAbnormal   _1  (1.702 m)    74.7 kg (164 lb 10.9 oz)

## 2020-11-09 ENCOUNTER — Other Ambulatory Visit (HOSPITAL_BASED_OUTPATIENT_CLINIC_OR_DEPARTMENT_OTHER): Payer: Self-pay | Admitting: Surgical Oncology

## 2020-11-09 DIAGNOSIS — R52 Pain, unspecified: Secondary | ICD-10-CM

## 2020-11-09 NOTE — Anesthesia Preprocedure Evaluation (Addendum)
ANESTHESIA PRE-OPERATIVE EVALUATION    Patient Information    Name: Kelsey Rubio    MRN: [id number]    DOB: [birth year]    Age: 45 year old    Sex: female  Procedure(s):  LEFT BREAST LOCALIZED LUMPECTOMY  LEFT SENTINEL LYMPH NODE BIOPSY      Pre-op Vitals:   LMP 10/06/2020         Primary language spoken:  English    ROS/Medical History:      History of Present Illness: 45 yo F with PMH of HTN (HCTZ) and asthma (montelukast/albuterol) now s/f L lumpectomy, LN biopsy for L breast cancer.     General:  negative for General ROS   Cardiovascular:  hypertension,     Anesthesia History:   Pulmonary:   asthma,     Neuro/Psych:   negative neuro/psych ROS   Hematology/Oncology:       GI/Hepatic:  negative GI/hepatic ROS Infectious Disease:  negative for infectious disease     Renal:  negative renal ROS   Endocrine/Other:  negative endo/other ROS     Pregnancy History:   Pediatrics:         Pre Anesthesia Testing (PCC/CPC) notes/comments:                          Pt declines block, amenable to taking tylenol and gabapentin for pain pre-operatively.    Pt took 2 puffs of albuterol inhaler in pre-op area                 Physical Exam    Airway:    Inter-inciser distance 3-4 cm    Mallampati: II  Neck ROM: full  TM distance: 5-6 cm  Short thick neck: No          Cardiovascular:  - cardiovascular exam normal         Pulmonary:  - pulmonary exam normal           Neuro/Neck/Skeletal/Skin:      Dental:  - normal exam    Abdominal:              Last  OSA (STOP BANG) Score:  No data recorded    Last OSA  (STOP) Score for   Has a physician diagnosed you with sleep apnea?: No  Do you use a CPAP at home?: No                   Past Medical History:   Diagnosis Date    Asthma     Hypertension     Palpitations      No past surgical history on file.  Social History     Socioeconomic History    Marital status: Single   Tobacco Use    Smoking status: Never    Smokeless tobacco: Never   Substance and Sexual Activity    Alcohol use: Yes      Alcohol/week: 2.0 standard drinks     Types: 2 Glasses of wine per week     Comment: once a week    Drug use: Never     Alcohol Use: Not on file       No current facility-administered medications for this encounter.     Current Outpatient Medications   Medication Sig Dispense Refill    albuterol 108 (90 Base) MCG/ACT inhaler INHALE 2 PUFFS BY MOUTH EVERY 4 TO 6 HOURS AS NEEDED      Beclomethasone Diprop HFA (QVAR  REDIHALER IN)       hydrochlorothiazide (HYDRODIURIL) 25 MG tablet Take 25 mg by mouth daily.      montelukast (SINGULAIR) 10 MG tablet every evening.       Allergies   Allergen Reactions    Aspirin Other     Wheezing and stuffy nose    Ibuprofen Other     Wheezing and stuffy nose       Labs and Other Data  Lab Results   Component Value Date    NA 140 09/17/2020    K 3.5 09/17/2020    CL 99 09/17/2020    BICARB 28 09/17/2020    BUN 14 09/17/2020    CREAT 0.88 09/17/2020    GLU 91 09/17/2020    [place] 9.7 09/17/2020     No results found for: AST, ALT, GGT, LDH, ALK, TP, ALB, TBILI, DBILI  Lab Results   Component Value Date    WBC 10.9 (H) 09/17/2020    RBC 4.25 09/17/2020    HGB 14.4 09/17/2020    HCT 40.8 09/17/2020    MCV 96.0 (H) 09/17/2020    MCHC 35.3 09/17/2020    RDW 12.2 09/17/2020    PLT 327 09/17/2020    MPV 9.7 09/17/2020    SEG 47 09/17/2020    LYMPHS 34 09/17/2020    MONOS 12 09/17/2020    EOS 5 09/17/2020    BASOS 1 09/17/2020     No results found for: INR, PTT  No results found for: ARTPH, ARTPO2, ARTPCO2    Anesthesia Plan:  Risks and Benefits of Anesthesia  I have personally performed an appropriate pre-anesthesia physical exam of the patient (including heart, lungs, and airway) prior to the anesthetic and reviewed the pertinent medical history, drug and allergy history, laboratory and imaging studies and consultations.   I have determined that the patient has had adequate assessment and testing.  I have validated the documentation of these elements of the patient exam and/or have made  necessary changes to reflect my own observations during my pre-anesthesia exam.  Anesthetic techniques, invasive monitors, anesthetic drugs for induction, maintenance and post-operative analgesia, risks and alternatives have been explained to the patient and/or patient's representatives.    I have prescribed the anesthetic plan:         Planned anesthesia method: General         ASA 2 (Mild systemic disease)     Potential anesthesia problems identified and risks including but not limited to the following were discussed with patient and/or patient's representative: Adverse or allergic drug reaction, Recall, Ocular injury, Dental injury or sore throat, Nerve injury and Injury to brain, heart and other organs    No Beta Blocker Indicated: Patient not on beta blockers    Planned monitoring method: Routine monitoring    Informed Consent:  Anesthetic plan and risks discussed with Patient.    Plan discussed with CRNA, Attending and Surgeon.

## 2020-11-10 ENCOUNTER — Encounter (HOSPITAL_COMMUNITY): Admission: RE | Disposition: A | Discharge status: Home Health | Payer: Self-pay | Attending: Surgical Oncology

## 2020-11-10 ENCOUNTER — Ambulatory Visit (HOSPITAL_BASED_OUTPATIENT_CLINIC_OR_DEPARTMENT_OTHER): Payer: Medicaid Other

## 2020-11-10 ENCOUNTER — Ambulatory Visit
Admission: RE | Admit: 2020-11-10 | Discharge: 2020-11-10 | Disposition: A | Discharge status: Home / Self Care | Payer: Medicaid Other | Attending: Surgical Oncology | Admitting: Surgical Oncology

## 2020-11-10 ENCOUNTER — Ambulatory Visit (HOSPITAL_COMMUNITY): Payer: Medicaid Other | Admitting: Physician Assistant

## 2020-11-10 ENCOUNTER — Ambulatory Visit (HOSPITAL_BASED_OUTPATIENT_CLINIC_OR_DEPARTMENT_OTHER)
Admit: 2020-11-10 | Discharge: 2020-11-10 | Disposition: A | Discharge status: Home Health | Payer: Medicaid Other | Attending: Surgical Oncology | Admitting: Surgical Oncology

## 2020-11-10 ENCOUNTER — Encounter (HOSPITAL_COMMUNITY): Payer: Self-pay | Admitting: Anesthesiology

## 2020-11-10 ENCOUNTER — Ambulatory Visit (HOSPITAL_BASED_OUTPATIENT_CLINIC_OR_DEPARTMENT_OTHER): Payer: Self-pay | Admitting: Diagnostic Radiology

## 2020-11-10 ENCOUNTER — Ambulatory Visit (HOSPITAL_BASED_OUTPATIENT_CLINIC_OR_DEPARTMENT_OTHER): Payer: Medicaid Other | Admitting: Physician Assistant

## 2020-11-10 ENCOUNTER — Other Ambulatory Visit (HOSPITAL_BASED_OUTPATIENT_CLINIC_OR_DEPARTMENT_OTHER): Payer: Self-pay | Admitting: Surgical Oncology

## 2020-11-10 ENCOUNTER — Other Ambulatory Visit: Payer: Self-pay

## 2020-11-10 DIAGNOSIS — R928 Other abnormal and inconclusive findings on diagnostic imaging of breast: Secondary | ICD-10-CM

## 2020-11-10 DIAGNOSIS — C50919 Malignant neoplasm of unspecified site of unspecified female breast: Secondary | ICD-10-CM | POA: Insufficient documentation

## 2020-11-10 DIAGNOSIS — Z886 Allergy status to analgesic agent status: Secondary | ICD-10-CM | POA: Insufficient documentation

## 2020-11-10 DIAGNOSIS — N6092 Unspecified benign mammary dysplasia of left breast: Secondary | ICD-10-CM

## 2020-11-10 DIAGNOSIS — C50912 Malignant neoplasm of unspecified site of left female breast: Secondary | ICD-10-CM

## 2020-11-10 DIAGNOSIS — Z170 Estrogen receptor positive status [ER+]: Secondary | ICD-10-CM | POA: Insufficient documentation

## 2020-11-10 DIAGNOSIS — C50412 Malignant neoplasm of upper-outer quadrant of left female breast: Secondary | ICD-10-CM

## 2020-11-10 DIAGNOSIS — R52 Pain, unspecified: Secondary | ICD-10-CM

## 2020-11-10 DIAGNOSIS — J45909 Unspecified asthma, uncomplicated: Secondary | ICD-10-CM | POA: Insufficient documentation

## 2020-11-10 DIAGNOSIS — R920 Mammographic microcalcification found on diagnostic imaging of breast: Secondary | ICD-10-CM

## 2020-11-10 DIAGNOSIS — I10 Essential (primary) hypertension: Secondary | ICD-10-CM | POA: Insufficient documentation

## 2020-11-10 DIAGNOSIS — Z7951 Long term (current) use of inhaled steroids: Secondary | ICD-10-CM | POA: Insufficient documentation

## 2020-11-10 DIAGNOSIS — N6321 Unspecified lump in the left breast, upper outer quadrant: Secondary | ICD-10-CM

## 2020-11-10 DIAGNOSIS — Z978 Presence of other specified devices: Secondary | ICD-10-CM

## 2020-11-10 SURGERY — BIOPSY, BREAST, WITH LUMPECTOMY
Anesthesia: General | Site: Breast | Laterality: Left | Wound class: Class I (Clean)

## 2020-11-10 MED ORDER — OXYCODONE HCL 5 MG OR TABS
5.0000 mg | ORAL_TABLET | ORAL | Status: DC | PRN
Start: 2020-11-10 — End: 2020-11-10

## 2020-11-10 MED ORDER — ISOSULFAN BLUE 1 % SC SOLN
SUBCUTANEOUS | Status: AC
Start: 2020-11-10 — End: ?
  Filled 2020-11-10: qty 5

## 2020-11-10 MED ORDER — FENTANYL CITRATE (PF) 100 MCG/2ML IJ SOLN
INTRAMUSCULAR | Status: AC
Start: 2020-11-10 — End: ?
  Filled 2020-11-10: qty 2

## 2020-11-10 MED ORDER — OXYCODONE HCL 5 MG OR TABS
5.0000 mg | ORAL_TABLET | ORAL | 0 refills | Status: DC | PRN
Start: 2020-11-10 — End: 2020-11-11
  Filled 2020-11-10: qty 5, 1d supply, fill #0

## 2020-11-10 MED ORDER — LIDOCAINE, BUFFERED 1% IJ SOLN (COMPOUNDED)
INTRADERMAL | Status: AC
Start: 2020-11-10 — End: ?
  Filled 2020-11-10: qty 20

## 2020-11-10 MED ORDER — HYDROMORPHONE HCL 1 MG/ML IJ SOLN
0.5000 mg | INTRAMUSCULAR | Status: DC | PRN
Start: 2020-11-10 — End: 2020-11-10
  Administered 2020-11-10 (×3): 0.5 mg via INTRAVENOUS
  Filled 2020-11-10 (×2): qty 0.5

## 2020-11-10 MED ORDER — MIDAZOLAM HCL 2 MG/2ML IJ SOLN
INTRAMUSCULAR | Status: AC
Start: 2020-11-10 — End: ?
  Filled 2020-11-10: qty 2

## 2020-11-10 MED ORDER — ACETAMINOPHEN 325 MG PO TABS
650.0000 mg | ORAL_TABLET | ORAL | 0 refills | Status: AC | PRN
Start: 2020-11-10 — End: ?
  Filled 2020-11-10: qty 60, 5d supply, fill #0

## 2020-11-10 MED ORDER — ACETAMINOPHEN 325 MG PO TABS
975.0000 mg | ORAL_TABLET | Freq: Once | ORAL | Status: AC
Start: 2020-11-10 — End: 2020-11-10
  Administered 2020-11-10 (×2): 975 mg via ORAL
  Filled 2020-11-10: qty 3

## 2020-11-10 MED ORDER — TECHNETIUM TC 99M TILMANOCEPT IJ KIT
0.5000 | PACK | Freq: Once | INTRAVENOUS | Status: AC
Start: 2020-11-10 — End: 2020-11-10
  Administered 2020-11-10 (×2): 0.5 via INTRADERMAL
  Filled 2020-11-10: qty 0.5

## 2020-11-10 MED ORDER — GABAPENTIN 100 MG OR CAPS
200.0000 mg | ORAL_CAPSULE | Freq: Three times a day (TID) | ORAL | Status: DC
Start: 2020-11-10 — End: 2020-11-10

## 2020-11-10 MED ORDER — LACTATED RINGERS IV SOLN
INTRAVENOUS | Status: DC
Start: 2020-11-10 — End: 2020-11-10

## 2020-11-10 MED ORDER — NALOXONE HCL 0.4 MG/ML IJ SOLN
0.1000 mg | INTRAMUSCULAR | Status: DC | PRN
Start: 2020-11-10 — End: 2020-11-10

## 2020-11-10 MED ORDER — GADOBUTROL 1 MMOL/ML IV SOLN (WRAPPED RECORD)
7.0000 mL | Freq: Once | INTRAVENOUS | Status: AC
Start: 2020-11-10 — End: 2020-11-10
  Administered 2020-11-10 (×2): 7 mL via INTRAVENOUS

## 2020-11-10 MED ORDER — DOCUSATE SODIUM 250 MG OR CAPS
250.0000 mg | ORAL_CAPSULE | Freq: Two times a day (BID) | ORAL | 0 refills | Status: DC
Start: 2020-11-10 — End: 2021-03-05
  Filled 2020-11-10: qty 30, 15d supply, fill #0

## 2020-11-10 MED ORDER — MIDAZOLAM HCL 2 MG/2ML IJ SOLN
INTRAMUSCULAR | Status: DC | PRN
Start: 2020-11-10 — End: 2020-11-10
  Administered 2020-11-10 (×2): 2 mg via INTRAVENOUS

## 2020-11-10 MED ORDER — TRAMADOL HCL 50 MG OR TABS
50.0000 mg | ORAL_TABLET | Freq: Four times a day (QID) | ORAL | 0 refills | Status: DC | PRN
Start: 2020-11-10 — End: 2020-11-25
  Filled 2020-11-10: qty 5, 2d supply, fill #0

## 2020-11-10 MED ORDER — BUPIVACAINE HCL (PF) 0.25 % IJ SOLN
INTRAMUSCULAR | Status: DC | PRN
Start: 2020-11-10 — End: 2020-11-10
  Administered 2020-11-10 (×2): 60 mL

## 2020-11-10 MED ORDER — ONDANSETRON HCL 4 MG/2ML IV SOLN
INTRAMUSCULAR | Status: DC | PRN
Start: 2020-11-10 — End: 2020-11-10
  Administered 2020-11-10 (×2): 4 mg via INTRAVENOUS

## 2020-11-10 MED ORDER — DEXMEDETOMIDINE HCL 200 MCG/2ML IV SOLN
INTRAVENOUS | Status: DC | PRN
Start: 2020-11-10 — End: 2020-11-10
  Administered 2020-11-10 (×2): 4 ug via INTRAVENOUS
  Administered 2020-11-10: 6 ug via INTRAVENOUS
  Administered 2020-11-10: 4 ug via INTRAVENOUS

## 2020-11-10 MED ORDER — GABAPENTIN 300 MG OR CAPS
300.0000 mg | ORAL_CAPSULE | Freq: Once | ORAL | Status: AC
Start: 2020-11-10 — End: 2020-11-10
  Administered 2020-11-10 (×2): 300 mg via ORAL
  Filled 2020-11-10: qty 1

## 2020-11-10 MED ORDER — LACTATED RINGERS IV SOLN
INTRAVENOUS | Status: DC | PRN
Start: 2020-11-10 — End: 2020-11-10

## 2020-11-10 MED ORDER — FENTANYL CITRATE (PF) 50 MCG/ML IJ SOLN (WRAPPED RECORD) ~~LOC~~
25.0000 ug | INTRAMUSCULAR | Status: DC | PRN
Start: 2020-11-10 — End: 2020-11-10

## 2020-11-10 MED ORDER — FENTANYL CITRATE (PF) 50 MCG/ML IJ SOLN (WRAPPED RECORD) ~~LOC~~
50.0000 ug | INTRAMUSCULAR | Status: DC | PRN
Start: 2020-11-10 — End: 2020-11-10
  Administered 2020-11-10 (×2): 50 ug via INTRAVENOUS
  Filled 2020-11-10: qty 1

## 2020-11-10 MED ORDER — TRANEXAMIC ACID 1000 MG/10ML IV SOLN
INTRAVENOUS | Status: AC
Start: 2020-11-10 — End: ?
  Filled 2020-11-10: qty 10

## 2020-11-10 MED ORDER — FENTANYL CITRATE (PF) 250 MCG/5ML IJ SOLN
INTRAMUSCULAR | Status: DC | PRN
Start: 2020-11-10 — End: 2020-11-10
  Administered 2020-11-10 (×5): 25 ug via INTRAVENOUS

## 2020-11-10 MED ORDER — GADOBUTROL 1 MMOL/ML IV SOLN (WRAPPED RECORD)
INTRAVENOUS | Status: AC
Start: 2020-11-10 — End: ?
  Filled 2020-11-10: qty 7.5

## 2020-11-10 MED ORDER — CEFAZOLIN SODIUM 1 GM IJ SOLR
INTRAMUSCULAR | Status: DC | PRN
Start: 2020-11-10 — End: 2020-11-10
  Administered 2020-11-10 (×2): 2000 mg via INTRAVENOUS

## 2020-11-10 MED ORDER — BUPIVACAINE HCL (PF) 0.25 % IJ SOLN
INTRAMUSCULAR | Status: AC
Start: 2020-11-10 — End: ?
  Filled 2020-11-10: qty 30

## 2020-11-10 MED ORDER — DEXAMETHASONE SODIUM PHOSPHATE 4 MG/ML IJ SOLN (CUSTOM)
INTRAMUSCULAR | Status: DC | PRN
Start: 2020-11-10 — End: 2020-11-10
  Administered 2020-11-10 (×2): 6 mg via INTRAVENOUS

## 2020-11-10 MED ORDER — OXYCODONE HCL 5 MG OR TABS
10.0000 mg | ORAL_TABLET | ORAL | Status: DC | PRN
Start: 2020-11-10 — End: 2020-11-10
  Administered 2020-11-10 (×2): 10 mg via ORAL
  Filled 2020-11-10: qty 2

## 2020-11-10 MED ORDER — SENNA 8.6 MG OR TABS
2.0000 | ORAL_TABLET | Freq: Every evening | ORAL | Status: DC
Start: 2020-11-10 — End: 2020-11-10

## 2020-11-10 MED ORDER — PROPOFOL 1000 MG/100ML IV EMUL
INTRAVENOUS | Status: DC | PRN
Start: 2020-11-10 — End: 2020-11-10
  Administered 2020-11-10: 25 ug/kg/min via INTRAVENOUS
  Administered 2020-11-10 (×2): 50 ug/kg/min via INTRAVENOUS

## 2020-11-10 MED ORDER — DIPHENHYDRAMINE HCL 50 MG/ML IJ SOLN
12.5000 mg | Freq: Once | INTRAMUSCULAR | Status: DC | PRN
Start: 2020-11-10 — End: 2020-11-10

## 2020-11-10 MED ORDER — ALBUTEROL SULFATE 108 (90 BASE) MCG/ACT IN AERS
INHALATION_SPRAY | RESPIRATORY_TRACT | Status: DC | PRN
Start: 2020-11-10 — End: 2020-11-10
  Administered 2020-11-10 (×2): 2 via RESPIRATORY_TRACT

## 2020-11-10 MED ORDER — ONDANSETRON HCL 4 MG/2ML IV SOLN
4.0000 mg | Freq: Once | INTRAMUSCULAR | Status: DC | PRN
Start: 2020-11-10 — End: 2020-11-10
  Administered 2020-11-10 (×2): 4 mg via INTRAVENOUS
  Filled 2020-11-10: qty 2

## 2020-11-10 MED ORDER — ACETAMINOPHEN 325 MG PO TABS
975.0000 mg | ORAL_TABLET | Freq: Three times a day (TID) | ORAL | Status: DC
Start: 2020-11-10 — End: 2020-11-10

## 2020-11-10 MED ORDER — PROPOFOL 200 MG/20ML IV EMUL
INTRAVENOUS | Status: DC | PRN
Start: 2020-11-10 — End: 2020-11-10
  Administered 2020-11-10: 200 mg via INTRAVENOUS
  Administered 2020-11-10: 50 mg via INTRAVENOUS
  Administered 2020-11-10 (×2): 40 mg via INTRAVENOUS
  Administered 2020-11-10: 12:00:00 200 mg via INTRAVENOUS

## 2020-11-10 MED ORDER — ISOSULFAN BLUE 1 % SC SOLN
SUBCUTANEOUS | Status: DC | PRN
Start: 2020-11-10 — End: 2020-11-10
  Administered 2020-11-10 (×2): 2 mL via SUBCUTANEOUS

## 2020-11-10 MED ORDER — LIDOCAINE HCL 2 % IJ SOLN WRAPPED RECORD
INTRAMUSCULAR | Status: DC | PRN
Start: 2020-11-10 — End: 2020-11-10
  Administered 2020-11-10: 80 mg via INTRAVENOUS
  Administered 2020-11-10: 40 mg via INTRAVENOUS
  Administered 2020-11-10: 12:00:00 80 mg via INTRAVENOUS

## 2020-11-10 MED ORDER — TRANEXAMIC ACID 1000 MG/10ML IV SOLN
INTRAVENOUS | Status: DC | PRN
Start: 2020-11-10 — End: 2020-11-10
  Administered 2020-11-10 (×2): 1000 mg via INTRA_ARTICULAR

## 2020-11-10 SURGICAL SUPPLY — 56 items
ADHESIVE SKIN SWIFTSET TOPICAL (Dressings/packing) ×2 IMPLANT
APPLICATOR CHLORAPREP 26ML, ~~LOC~~ (Prep Solutions)
BAG DEVON SCOPE ADJUSTMENT BAG (Drapes/towels) ×2 IMPLANT
CAUTERY TIP EDGE BLADE ELECTRODE 2.75", INSULATED (Cautery) ×2 IMPLANT
CONTAINER PRECISION SPECIMEN, 4OZ- STERILE (Misc Medical Supply) ×4 IMPLANT
DEVICE SPEC MAMO SPECBOARD (Misc Medical Supply) IMPLANT
DRAPE HALF SHEET MEDIUM (Drapes/towels)
DRAPE IOBAN 2 ANTIMICROBIAL 23" X 17" (Misc Surgical Supply) ×2
DRAPE LITHOTOMY LEGGING W/CUFF 31" X 48" (Drapes/towels) ×2 IMPLANT
DRAPE ORTHO U 76" X 120" (Drapes/towels) ×2 IMPLANT
DRESSING BIOPATCH OD 1" ID 4MM (Dressings/packing)
DRESSING MEPILEX BORDER FLEX 3IN  X 3IN (Dressings/packing)
DRESSING MEPILEX BORDER FLEX 3IN X 3IN (Dressings/packing) IMPLANT
DRESSING SPONGE CURITY 4X4 16 PLY (Dressings/packing) ×2 IMPLANT
DRESSING TELFA 3" X 4" STRL (Dressings/packing) IMPLANT
FILM IOBAN 2 ANTIMICROBIAL 23" X 17" (Misc Surgical Supply) ×1 IMPLANT
GLOVE BIOGEL PI INDICATOR SIZE 6 (Gloves/Gowns) ×2 IMPLANT
GLOVE BIOGEL PI INDICATOR SIZE 7 (Gloves/Gowns) ×2 IMPLANT
GLOVE SURGEON BIOGEL SIZE 7 (Gloves/Gowns) ×6 IMPLANT
GLOVE SURGEON BIOGEL SIZE 7.5 (Gloves/Gowns) ×2 IMPLANT
GLOVE SURGICAL BIOGEL SIZE 6 (Gloves/Gowns) ×6 IMPLANT
GLOVE SURGICAL BIOGEL SIZE 8 (Gloves/Gowns) ×4
GOWN MICRO COOL LG BLUE, AAMI LVL 4 (Gloves/Gowns)
HEMOSTAT SURGICEL FIBRILLAR 4" X 4" (Hemostatic agents/wax/sealants-absorbable) ×2
JMC ONLY- O.R. CAMERA COVER LIGHT, STERILE (Misc Surgical Supply) IMPLANT
NEEDLE PROTECT 18G X 1.5" (Needles/punch/cannula/biopsy) ×2 IMPLANT
NEEDLE PROTECT 22G X 1.5" (Needles/punch/cannula/biopsy) ×2 IMPLANT
NEEDLE PROTECT 25G X 1.5" (Needles/punch/cannula/biopsy)
PAD GROUND VALLEYLAB REM ADULT E7507 (Cautery) ×2 IMPLANT
POSITIONER HEAD FOAM 9" DONUT (Misc Medical Supply) ×2 IMPLANT
PROTECTOR ULNAR NERVE PAD, YELLOW (Patient Care Supply) IMPLANT
SHEATH GUIDE CIV-FLEX SAVI-SCOUT (Misc Surgical Supply) ×2 IMPLANT
SKIN AFFIX 0.4ML HV (Dressings/packing)
SLEEVE SCD KNEE MEDIUM (Patient Care Supply) IMPLANT
SOLUTION IRR POUR BTL 0.9% NS 1000ML (Non-Pharmacy Meds/Solutions) IMPLANT
SOLUTION IRR POUR BTL H20 1000ML (Non-Pharmacy Meds/Solutions) IMPLANT
SPONGE LAP RF DETECT 18" X 18" XRAY STERILE (Sponges) IMPLANT
STAPLER PROXIMATE SKIN 35 WIDE (Staplers and staple reloads) ×2 IMPLANT
STOCKINETTE TUBULAR 4" X 48" STERILE (Dressings/packing) IMPLANT
STRIP MEDI-STRIP SKIN CLOSURE 1/2 X 4" (Dressings/packing) ×1 IMPLANT
STRIP SKIN CLOSURE 1/2 X 4 (Dressings/packing) ×2
SUPPORT MAMMARY LG (External orthotic collars/braces/supports)
SUPPORT MAMMARY MEDIUM (External orthotic collars/braces/supports) IMPLANT
SURGICAL PACK BASIC MAJOR SET-UP (Procedure Packs/kits) ×2 IMPLANT
SUTURE MONOCRYL 4-0 18" PS-2 (Y496) (Suture) IMPLANT
SUTURE MONOCRYL PLUS 4-0 27" PS-2 MCP426 (Suture) ×4 IMPLANT
SUTURE PERMA-HAND SILK 2-0 18" FS 685H (Suture) ×2 IMPLANT
SUTURE PERMA-HAND SILK 2-0 30" SH K833H (Suture) ×4
SUTURE PLAIN GUT 5-0 18" PC-1 1915, FAST ABSORB (Suture) ×2
SUTURE VICRYL PLUS 2-0 27" SH VCP417 (Suture) ×4 IMPLANT
SUTURE VICRYL PLUS 3-0 27" SH VCP416 (Suture) ×4 IMPLANT
SYRINGE HYPO LL 10CC (Needles/punch/cannula/biopsy) ×2 IMPLANT
SYRINGE HYPO LL 3CC (Needles/punch/cannula/biopsy) IMPLANT
TOWELS OR BLUE 4-PACK STERILE, DISPOSABLE (Drapes/towels) ×2 IMPLANT
TUBING SUCTION 2' WITH CONNECTOR AND CLAMP (Tubing/Suction) ×2 IMPLANT
YANKAUER BULB TIP ON/OFF CONTROL STERILE (Tubing/Suction) IMPLANT

## 2020-11-10 NOTE — Brief Op Note (Signed)
BRIEF OPERATIVE NOTE     CASE ID: [id number]    DATE: 11/10/2020  TIME: 2:13 PM    Preoperative Diagnosis: Left Breast Invasive Lobular Carcinoma  Postoperative Diagnosis: Same    Procedure:  Procedure(s):  LEFT BREAST LOCALIZED LUMPECTOMY - Wound Class: Class I (Clean) - Incision Closure: Deep and Superficial Layers  LEFT SENTINEL LYMPH NODE BIOPSY - Wound Class: Class I (Clean) - Incision Closure: Deep and Superficial Layers     Surgeons:  Primary: Remer Macho, MD  Resident - Assisting: Serita Butcher, MD    Anesthesia:  Anesthesiologist: Hope Budds, DO  CRNA: Terrance Mass, CRNA     OR Staff  Circulator: Emmie Niemann, RN; Dallie Piles, RN  Physician Assistant: Nanafalia Shock, PA; Doree Fudge, PA  Scrub: Merril Abbe Carmine Savoy, RN    Findings:   - Wire guided excision of previously identified breast malignancy   - True margin excised at the superomedial border of primary mass excision   - Localization of sentinal node with radioisotope guidance, lymphazurine blue injection. Excision of 3 lymph nodes   - Surgical incisions closed in layers    Specimens:   ID Type Source Tests Collected by Time Destination   A : Left Breast Lumpectomy, Single superior, double lateral  Lumpectomy Breast, Left PATHOLOGY TISSUE EXAM Remer Macho, MD 11/10/2020 1305    B : Reexcision cavity, double two margin surface, single is lateral  Biopsy Breast, Left PATHOLOGY TISSUE EXAM Remer Macho, MD 11/10/2020 1313    C : Left axillary sentinel lymph node  Tissue Sentinel Lymph Node PATHOLOGY TISSUE EXAM Remer Macho, MD 11/10/2020 1316        Implants: * No implants in log *    Fluids/Blood Products:    IV Fluids: 711mL    Blood Products: None    EBL: 46mL    Urine Output: Not recorded    Complications: None    Dispo: Stable to PACU    Event Time In   In Facility 1018   Pre Procedure Start [place] Complete 1035   Pre Procedure Tasks Complete 1200   Room Setup Start  1150   Room Ready for Patient 7841   In Room 1212   Incision 1249   Closing Started 1347   Closing Complete    Out of Room    Room Cleanup Start    Room Cleanup End    In PACU    PACU Criteria Complete    PACU Hold    PACU Hold Complete    In Recovery    Recovery Criteria Complete    Ready For Visitors    Holding in OR    Anesthesia Start 1212   Anesthesia Ready 1222   Anesthesia Stop    Epidural to C-Section    Regional Anesthesia Start    Regional Anesthesia Stop    Regional Block Administered         Plan:  - To be discharged to home     Oversight of patient care provided by Dr. Fayrene Fearing, MD. PGY-3  General Surgery [phone]

## 2020-11-10 NOTE — Consults (Signed)
Regional Anesthesia Service Initial Consult Note  Service Pager: [phone]    Referring MD: Benjie Karvonen    Referring Department: Breast surgery  Regional Anesthesia Attending: Farrel Conners      History of Present Illness:  Kelsey Rubio is a 45 year old female with a PMH significant for breast cancer who was admitted on 11/10/2020 for lumpectomy and sentinel lymph node biopsy.    Patient's anticipated pain will be in the chest and axilla.  The patient currently reports a pain score of 0/10.    The primary service has requested that we evaluate the patient to determine if they are an appropriate candidate for a regional anesthestic intervention to assist them with acute pain management.    Anticoagulation reviewed: none    Home Medications:  Medications Prior to Admission   Medication Sig Dispense Refill Last Dose    albuterol 108 (90 Base) MCG/ACT inhaler INHALE 2 PUFFS BY MOUTH EVERY 4 TO 6 HOURS AS NEEDED   11/09/2020 at 2000    Beclomethasone Diprop HFA (QVAR REDIHALER IN)    11/09/2020 at 2000    hydrochlorothiazide (HYDRODIURIL) 25 MG tablet Take 25 mg by mouth daily.   11/10/2020 at 0430    montelukast (SINGULAIR) 10 MG tablet every evening.   11/09/2020 at 2000        Allergies   Allergen Reactions    Aspirin Other     Wheezing and stuffy nose    Ibuprofen Other     Wheezing and stuffy nose     Past Medical History:   Diagnosis Date    Asthma     Hypertension     Palpitations      No past surgical history on file.  Social History     Socioeconomic History    Marital status: Single   Tobacco Use    Smoking status: Never    Smokeless tobacco: Never   Substance and Sexual Activity    Alcohol use: Yes     Alcohol/week: 2.0 standard drinks     Types: 2 Glasses of wine per week     Comment: once a week    Drug use: Never     No family history on file.      Review of Systems:  General: Negative  Psychiatry: Negative  HEENT: Negative  Cardiovascular: Negative  Respiratory: Negative  Gastrointestinal: Negative  Neuro:  negative  Musculoskeletal: Negative    Remainder of a 12 point review of systems is negative      Imaging Studies:   No results found for this or any previous visit.      Labs:  Lab Results   Component Value Date    WBC 10.9 (H) 09/17/2020    RBC 4.25 09/17/2020    HGB 14.4 09/17/2020    HCT 40.8 09/17/2020    MCV 96.0 (H) 09/17/2020    MCHC 35.3 09/17/2020    RDW 12.2 09/17/2020    PLT 327 09/17/2020    MPV 9.7 09/17/2020     No results found for: INR, PTT         Physical Exam:  Vitals: BP 141/82 (BP Location: Right arm, BP Patient Position: Sitting)    Pulse 76    Temp 36.4 C    Resp 18    Ht 5' 6.2" (1.681 m)    Wt 73.8 kg (162 lb 12.8 oz)    LMP 10/06/2020    SpO2 98%    BMI 26.12 kg/m   General: alert, cooperative  Mental Status:  alert and awake. Speech is clear/ normal  Affect: euthymic  Respiratory: Breathing comfortably, speaking in full sentences.  Head/eyes: Unremarkable  Skin: intact, no lesions  Neuro: intact, moving all extremities, answering questions appropriately  Musculoskeletal: no weakness or pain  Abdominal: Benign      Assessment and Plan:  This is a 45 year old female with a history of breast cancer admitted for lumpectomy + SLN biopsy.  Dr. Benjie Karvonen, Warden Fillers, MD requested that the regional service evaluate the patient and their comorbidities to determine whether regional anesthesia interventions for acute pain management would have a favorable risk to benefit ratio for the patient.    Assessment: Based on the risks, benefits, and alternatives, the patient is an appropriate candidate for a regional anesthesia procedure, however after a discussion of the risks and benefits of a single shot paravertebral nerve block the patient declined the block    Recommendations:   Pain/ Neuro:        - patient declined paravertebral nerve block       - Patient to use opioids and other adjuvant pain medications as appropriate for breakthrough pain       - Patient to use immobilization device for duration  of block until resolution for upper extremity catheters and femoral catheters.       - Weight-bearing status after procedure: non-weight bearing RUE.  CV: hemodynamically stable  Resp: stable and is not showing signs of oversedation.   FEN/GI: diet is NPO prior to surgery  Heme/ID: afebrile, WBC wnl, platelets stable  Anticoagulation/DVT prophylaxis: none      Risks, benefits and alternatives of the procedure were discussed, including but not limited to bleeding, infection, nerve injury, incidental nerve blocks, damage to nearby structures, reaction to medications administered, respiratory complications, and local anesthetic systemic toxicity. Questions answered and patient verbalized understanding.    Plan: Patient Decision: Declines nerve block      Patient discussed with Dr. Farrel Conners, who agrees with my assessment and plan.    Thank you for this consult. Please contact the regional anes fellow/resident on-call for questions [phone].  Note Author: Collene Leyden    REGIONAL ANESTHESIA ATTENDING CONSULT NOTE ATTESTATION    Subjective    I have reviewed the patient's history.  Kelsey Rubio is a 45 year old female for whom Dr. Benjie Karvonen, Warden Fillers, MD requested regional anesthesia service consult for anticipated postoperative pain.     Objective    I have examined the patient and concur with the resident/fellow exam.    Assessment and Plan    I agree with the resident/fellow care plan and have edited the documented consult note as necessary.    See the resident / fellow note for further details.    Time spent counseling or coordinating of care for this patient: 25 minutes    Note Author: Nelida Meuse, MD

## 2020-11-10 NOTE — Anesthesia Postprocedure Evaluation (Signed)
Anesthesia Post Note    Patient: Kelsey Rubio    Procedure(s) Performed: Procedure(s):  LEFT BREAST LOCALIZED LUMPECTOMY  LEFT SENTINEL LYMPH NODE BIOPSY      Final anesthesia type: General    Patient location: PACU    Post anesthesia pain: adequate analgesia    Mental status: awake, alert  and oriented    Airway Patent: Yes    Last Vitals:   Vitals Value Taken Time   BP 129/80 11/10/20 1545   Temp 36.3 C 11/10/20 1515   Pulse 79 11/10/20 1545   Resp 21 11/10/20 1545   SpO2 95 % 11/10/20 1545        Post vital signs: stable    Hydration: adequate    N/V:no    Anesthetic complications: no    Plan of care per primary team.

## 2020-11-10 NOTE — Plan of Care (Signed)
Problem: Promotion of Perioperative Health and Safety  Goal: Promotion of Health and Safety of the Perioperative Patient  Description: The patient remains safe, receives treatment appropriate to the surgical intervention and patient's physiological needs and is discharged or transferred to the appropriate level of care.    Information below is the current care plan.  Outcome: Progressing  Flowsheets (Taken 11/10/2020 1427)  Patient /Family stated Goal: UTA  Guidelines: PACU  Individualized Interventions/Recommendations #1: anticipate needs  Individualized Interventions/Recommendations #2 (if applicable): to safely discharge from PACU to an appropriate level of care

## 2020-11-10 NOTE — H&P (Signed)
Medical Record #: [id number]   DOB: 1975-09-06    Reason for Visit: Planned surgical procedure    History of Present Illness:     Kelsey Rubio is a 45 year old female who presents to surgery for Left lumpectomy and SLNB.    No fevers or chills. No cough, sore throat, flu-like symptoms, recent infections, SOB or CP.  Overall feeling well. No major complaints today. Ready to proceed.   She declined paravertebral block.    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  She presented with a palpable mass.  Imaging showed T2 mass, core bx ILC.  Last mammogram 2 yrs ago was normal.  No fhx breast [place].  She declined genetic testing.  Breast mri shows 6cm disease     Her medical hx and risk factors as follows:  OB History   No obstetric history on file.       Past Medical History  Past Medical History:   Diagnosis Date    Asthma     Hypertension     Palpitations        Past Surgical History  No past surgical history on file.    Family History  No family history on file.    Allergies  Allergies   Allergen Reactions    Aspirin Other     Wheezing and stuffy nose    Ibuprofen Other     Wheezing and stuffy nose       Medications  No current facility-administered medications for this encounter.       Social History  Social History     Socioeconomic History    Marital status: Single   Tobacco Use    Smoking status: Never    Smokeless tobacco: Never   Substance and Sexual Activity    Alcohol use: Yes     Alcohol/week: 2.0 standard drinks     Types: 2 Glasses of wine per week     Comment: once a week    Drug use: Never     Social History     Tobacco Use   Smoking Status Never   Smokeless Tobacco Never     Social History     Substance and Sexual Activity   Alcohol Use Yes    Alcohol/week:  2.0 standard drinks    Types: 2 Glasses of wine per week    Comment: once a week     Social History     Substance and Sexual Activity   Drug Use Never       Problem List  Patient Active Problem List   Diagnosis    Malignant neoplasm of upper-outer quadrant of left breast in female, estrogen receptor positive (CMS-HCC)       Physical Exam  BP 141/82 (BP Location: Right arm, BP Patient Position: Sitting)    Pulse 76    Temp 97.5 F (36.4 C)    Resp 18    Ht 5' 6.2" (1.681 m)    Wt 73.8 kg (162 lb 12.8 oz)    LMP 10/06/2020    SpO2 98%    BMI 26.12 kg/m   Body mass index is  26.12 kg/m.  General: Alert and oriented, well-appearing, NAD, pleasant mood and affect  Psych: Normal speech and thought processes  HEENT: NC, AT. EOMI. Sclerae anicteric  Resp: Breathing comfortably, speaking full sentences, O2 sat normal  Cardiovascular: no LE edema  Breast: Left breast with MR wire loc  Musculoskeletal: No lymphedema of upper extremity  Extremities: No erythema, cyanosis, swelling.   Neuro: Mental status normal, no facial droop, gait normal    Plan/Orders:  Continue with surgery as scheduled     All questions answered.   Pt verbalizes understanding of the above and agrees to proceed.    Doddridge Shock, PA-C 11/10/2020   11:15 AM  Pager [phone]

## 2020-11-10 NOTE — Discharge Instructions (Signed)
POST OPERATIVE INSTRUCTIONS  - You may go home wearing a Post-Surgical Bra.  - You may shower 72 hours (3 days) after your surgery.    - Prior to showering you may remove your post-surgical vest and any gauze. Place any gauze in the trash. Wash surgical area with soap and water and pat dry.  - Do NOT replace the gauze or place any dressing to the surgical site after your shower.   - You do not need to continue wearing the post-surgical vest after 72 hours (3 days).  - For added support you may wear a sports bra that is not too tight or you may wear a camisole that has a built in bra that is supportive and not too tight fitting.   - No heavy lifting. No lifting anything greater than 10 pounds (example: gallon of milk) for 4 weeks.   - No swimming until 30 days from your surgery date.  - If you have Steri-Strips on the surgical incision, do not remove and wait until you see your doctor for your follow up visit.   - You will be scheduled for a follow-up visit with your doctor approximately 10 days post-operatively to follow-up on how you are feeling after your surgery, follow-up on how your incision is healing, and follow-up on any other support needed.   - Your pathology from your surgery will be discussed with you at your follow up visit with your doctor.         *Please note that it takes the Pathology Department approximately 7-10 business days to provide a final pathology report*      WHEN TO CALL YOUR DOCTOR  Call your doctor immediately if you have any of the following:    New or increased pain around the tube  Redness, warmth, swelling, or unusual drainage around the wound(s) or tube(s)  Bright red or excessive drainage from the incision or in the drain(s)  Drainage that is foul-smelling (it is normal to have small particles of tissue in drainage)  Fever over 101oF  Fluid leaking around the tube  Stitches become loose  Tube or bulb falls off or breaks  Drains unable to hold suction  A sudden increase (>30 ml)  or stoppage in the amount of drainage      Any Questions and/or Symptoms please call your Nurse Case Manager:   [name] @ [phone]    CALL YOUR DOCTOR IF YOU EXPERIENCE ANY OF THE FOLLOWING SYMPTOMS:   - Fever higher than 100.4F (oral temperature)   - Redness of the skin at incision site    - Drainage    - Increased tenderness at the surgical site   - Cloudy, foul-smelling drainage   For any symptoms that occur during the weekend or after 5pm Monday through Friday please call the [facility] Operator @ [phone] and inform the operator that your surgeon, Dr. Sarah Blair needs to be contacted for the post-surgical symptoms that you are experiencing.

## 2020-11-11 ENCOUNTER — Telehealth (HOSPITAL_BASED_OUTPATIENT_CLINIC_OR_DEPARTMENT_OTHER): Payer: Self-pay

## 2020-11-11 ENCOUNTER — Encounter (HOSPITAL_BASED_OUTPATIENT_CLINIC_OR_DEPARTMENT_OTHER): Payer: Self-pay | Admitting: Surgical Oncology

## 2020-11-11 DIAGNOSIS — Z170 Estrogen receptor positive status [ER+]: Secondary | ICD-10-CM

## 2020-11-11 MED ORDER — GABAPENTIN 300 MG OR CAPS
300.0000 mg | ORAL_CAPSULE | Freq: Three times a day (TID) | ORAL | 0 refills | Status: AC
Start: 2020-11-11 — End: ?

## 2020-11-11 MED ORDER — OXYCODONE HCL 5 MG OR TABS
5.0000 mg | ORAL_TABLET | Freq: Four times a day (QID) | ORAL | 0 refills | Status: DC | PRN
Start: 2020-11-11 — End: 2021-03-05

## 2020-11-11 NOTE — Telephone Encounter (Signed)
Pt notified that Dr Benjie Karvonen sent a prescription for more oxycodone and also gabapentin to her [place]. Instructed that she alternate taking the acetaminophen with the oxycodone. Also to start off taking the gabapentin at night only. If it does not cause her to be too drowsy, then she can increase it to tid. And on Friday after she showers, she can also try OTC lidocaine 4% patches, and apply to where she feels pain in her underarm area. She verbalized understanding

## 2020-11-11 NOTE — Op Note (Signed)
DATE OF SERVICE:  11/10/2020    PREOPERATIVE DIAGNOSIS:  Left breast cancer.    POSTOPERATIVE DIAGNOSIS:  Left breast cancer.    PROCEDURE PERFORMED:  Left image guided lumpectomy, sentinel node  biopsy, injection of Lymphazurin.     SURGEON/STAFF:  Remer Macho, MD    ASSISTANT:  Serita Butcher, MD.    ANESTHESIA:  LMA.    FLUIDS:  A liter of crystalloid.    ESTIMATED BLOOD LOSS:  Minimal.    DRAINS:  None.    COMPLICATIONS:  None.    INDICATION:  Patient presented with a palpable mass.  Imaging showed  a T2 lesion that was biopsied and showed invasive lobular carcinoma.  She is here for breast conservation.  She had more findings on MRI,  so she had an MRI guided wire localization.  We talked about the  risks and benefits of surgery including bleeding, infection, possible  cosmetic deformity, possible sensory change, possible lymphedema,  possible reoperation, possible DVT, PE, MI, arrhythmia.  She wished  to proceed.     PROCEDURE IN DETAIL:  Patient was brought to the operating room,  placed in supine position, general anesthesia was induced.  LMA was  inserted.  She got weight-based Ancef.  She was prepped.  We injected  3 cc of Lymphazurin around the complex, massaged for 4 minutes.  She  was prepped and draped in sterile fashion.  We called a time-out,  confirmed operating correct patient, correct procedure.  We made a 5  cm incision encompassing all the wires at 2 o'clock in the left  breast with a knife.  Went through subcuticular and subcutaneous  tissue with electrocautery, raised skin flaps superior and  inferiorly.  We brought the wires into the field and removed the  specimen en bloc.  We labeled it single superior, double lateral.  We  x-rayed it, showed 3 wires and the clip were in the specimen.  Then  we put Allis clamps on the edge of the cavity, took another  centimeter in all directions.  We labeled this double true margin  surface, single was lateral.  Then we packed and turned our  attention  to the axilla.  We made a 3 cm incision below the hair-bearing area  of the axilla with a knife.  Went through subcuticular and  subcutaneous tissue with electrocautery.  Opened the axillary fascia.   We encountered 1 node that had a blue lymphatic going into it.  It  was hot with counts about 6000.  The other node was about 650.  We  used the cautery to coagulate small vessels and lymphatics.  Background counts were less than 50.  Then, we irrigated both sites,  got hemostasis with electrocautery and then we reapproximated the  deep breast tissue with 2-0 Vicryl and 3-0 and 4-0 Monocryl for skin.   We closed the axillary fascia with 2-0 Vicryl, then 3-0 and 4-0  Monocryl for skin.  Dermabond, Steri's, fluffs, and a bra.  The  patient tolerated procedure well, was discharged to recovery in  stable condition.  Sponge counts were correct x2.  Dr. Benjie Karvonen, the  attending, was present throughout the case.     Job #:  [id number]

## 2020-11-11 NOTE — Telephone Encounter (Signed)
Patient s/p Left image guided lumpectomy, sentinel node biopsy on 11/10/20  Called to check on patient postoperatively and ensure all is going well at home.    Patient reports the following:  1) Pain: having a lot of pain mostly to under arm area. Using tylenol and oxy. Tried tramadol last night with little relief. Instructed she take the tylenol atc for now and alternate with the oxycodone. She asked for refill of oxy as she only has 3 left  2) Taking narcotics? yes  If so, using colace? yes and drinking plenty of fluids? yes  3) Mobility: good, doing housework such as dishes and laundry. Instructed to let others do that for now  4) BMs:  Not ye  5) Fever, malaise, purulent drainage, or other sx of infection: denies  6) ROM: good  7) JPs:  none    Dr Benjie Karvonen notified of pts pain, she will order more oxy for pt    Patient reminded of the following:  -If you have a surgical vest/bra on, keep that on until you shower.  After 72 hours pt may switch to comfortable (not too tight) sports bra or other gentle support.  -Wait to shower until 3 days post-op and no later than day three post op.  No soaking in a tub.   -Keep all dressings on until post op visit.  However, if the clear dressing and gauze start to come off/get wet they should come off.  Do not replace the gauze, and do not peel steri-strips off.   -No heavy lifting/pushing/pulling (>10 lb) x 1 month.  -Reviewed infection precautions and advised pt to call ASAP with any new or concerning symptoms.    Patient has f/u appointment scheduled on 11/19/20 at 1 pm , and relayed that pathology will be discussed at this appointment.

## 2020-11-16 NOTE — Telephone Encounter (Signed)
TH, HC, [facility]. Routine

## 2020-11-16 NOTE — Telephone Encounter (Signed)
WQ updated.

## 2020-11-17 ENCOUNTER — Telehealth (INDEPENDENT_AMBULATORY_CARE_PROVIDER_SITE_OTHER): Payer: Self-pay

## 2020-11-17 ENCOUNTER — Telehealth (HOSPITAL_BASED_OUTPATIENT_CLINIC_OR_DEPARTMENT_OTHER): Payer: Self-pay

## 2020-11-17 DIAGNOSIS — C50919 Malignant neoplasm of unspecified site of unspecified female breast: Secondary | ICD-10-CM

## 2020-11-17 DIAGNOSIS — Z1211 Encounter for screening for malignant neoplasm of colon: Secondary | ICD-10-CM | POA: Insufficient documentation

## 2020-11-17 NOTE — Telephone Encounter (Signed)
First attempt. Left a voicemail message for the patient to call back for further assistance with scheduling a Colonoscopy procedure. When the patient calls back, please assist with scheduling as triaged. Thank you.

## 2020-11-17 NOTE — Telephone Encounter (Signed)
OT referral placed. Spoke with OT to request STAT appt. First available STAT appt is 11/23. Scheduled patient at 0900, check in at [place]. [facility] 3rd floor. Patient is on cancellation list.     Patient notified with above information. OT contact info provided. Patient plans to call on occasion to see if there are any cancellations.     She is asking if she can do radiation treatments at [facility] in [place] if this is something needed. Discussed that Dr. Benjie Karvonen will review plan on Thursday and that we can definitely send any referrals for services closer to home if this is what she prefers. She will discuss this with Dr. Benjie Karvonen at the appt.

## 2020-11-17 NOTE — Telephone Encounter (Signed)
Spoke with patient. Reports:    - Pain in L breast/axilla has let up some but has not resolved. Pain is 9/10 when she wakes up. 6/10 the rest of the day while taking pain meds. Pain causes her to toss and turn at night. Breast pain/bruising is better    - Hard ball under L axilla by incision on started on Sunday. Not visible. Denies any redness, warmth to touch or fevers. Axilla is in constant pain    - Burning nipple pain at times    - Holds L arm like it is in sling because it hurts too bad down arm, shoulder and breast to let it down. Carries breast because it is so heavy and causes pain. Can move arm and does    - Currently wears a Sports bra even to sleep in. Provides enough support to hold breasts in place. Asking if she will ever be able to sleep without a bra again. Discussed that she should be able to, it may just take her a little longer for the pain to subside    - Has to take pain meds daily. Concerned that she still needs them after a week. Taking Oxycodone 5mg  q6h. Provides some relief. Hasn't tried lidocaine patch or Gabapentin. Doesn't have patch and isn't driving. She will try Gabapentin tonight. Discussed that sometimes it can take longer for pain to resolve and that Dr. Benjie Karvonen will do exam at appt on Thurs    Discussed possible seroma in axilla and let patient know message will be forwarded to Dr. Benjie Karvonen for review and that NCM will update her with any recommendations. Patient states she is ok, just wanted to make sure this was normal and that she will discuss with Dr. Benjie Karvonen at Thursday's appt.

## 2020-11-17 NOTE — Patient Instructions (Signed)
[facility]  PHONE LIST FOR PATIENTS    Hours of operation:  Monday-Friday 8:00 - 4:30pm. Closed Holidays and Weekends    AFTER HOURS EMERGENCY NUMBER:    For urgent questions after hours, or on weekends/holidays, contact the [place] paging operator at [phone], and ask to speak to the doctor on call for Dr. Sarah Blair. Ask for On-Call Medical Oncologist for medical symptoms    [facility] front desk at: [phone]    For surgery scheduling, insurance or disability questions, please call,  Administrative Assistant Lori Morgan:Phone: [phone], Fax: [phone]    For nursing questions during weekdays, please call:   [name] Gudoy , RN, BSN, Nurse Case Manager at [phone]    Social Worker:   Brooke Mullineaux, LCSW - Phone: [phone] or Laurie Knight, BS, MSW, LCSW  - Phone: [phone]    [facility] Useful Phone Numbers     Imaging Scheduling Number: [phone]     Nuclear Medicine Bone Scan: [phone]     Scheduling Line (medical oncology, plastic surgery and breast surgery): [phone]     [place] [facility]: [phone]     [place] [facility]: [phone] or [phone]    [place] [facility]: [phone]     [place] [facility]: [phone]      Occupational Therapy: [phone]     Gynecology Oncology: [phone]     [facility] Genetics: [phone]     Plastic Surgery: [phone]    Information Desk: [phone]    General information, directions, phone numbers, registration and other available services     Financial Counselors: [phone] at [facility] [phone] or [phone]   Marisela, M[facility] Financial Counselor: [phone] [email or web address]    Insurance Contracting: [phone]   MED CENTER Billing: [phone]   MED GROUP (Physician) Billing: [phone]       Helpful Resources      Patient and Family Resource Center - Open daily; hours vary   Location: [facility], Room 1066, on the first floor just to the left of the lobby elevators   Phone [phone]     [facility] support groups (open to any patients in [place] area:) [email or web address]_Home.[email or web address]      American Cancer Society Breast [place] support groups: https://www.cancer.org/treatment/support-programs-and-services.html       Stage IV Zoom Breast Cancer Support group meets twice monthly. Sign up with the following link: [email or web address]/default.aspx?trumbaEmbed=filterview%3Dcancerdefault%26template%3Dlist     Use this link to learn more about breast cancer as a diagnosis.   http://www.[facility].com/info/general_health/breast_care/guide_breast_ca_dx_tx.pdf     Use this link to learn more about advance directives.  https://prepareforyourcare.org/content/default/common/documents/PREPARE-Pamphlet-Flat-English.pdf       Tell Us How We Did During Your Consultation/Follow Up Visit...     It is our priority to make sure that we are providing excellent patient care.    Your feedback goes a long way and makes a difference. If you would like to provide us feedback on how we did, you can contact the Patient Experience 'We Listen' Department via telephone, Email, or by mail. Below you will find the contact information for Patient Experience 'We Listen'     E-mail: [email or web address]   Phone: [phone]    Mail:   Patient Experience   [facility]    [address], [address]   [place] [address]        Thank you for the opportunity to care for you during this time.

## 2020-11-17 NOTE — Telephone Encounter (Signed)
Patient is calling concerning her pain level, she is s/p lumpectomy sentinel node biopsy.  Forwarding to [employer] for return call.

## 2020-11-17 NOTE — Interdisciplinary (Deleted)
Post-op follow up appointment with Dr. Benjie Karvonen   Exam done and records reviewed.   Ambulatory. Alert and oriented x 3    Nursing assessment:   45 year old old female with breast cancer hx s/p Left image guided lumpectomy, sentinel node biopsy on 11/10/20    Side effects reported: None  Surgery site: no s/s of infection  Pain: denies  Constipation: denies  Fatigue: denies  Appetite: Good   ROM: Good  Drains:n/a    Pathology result reviewed with the patient by Dr. Benjie Karvonen  FINAL PATHOLOGIC DIAGNOSIS:   A: Left breast, lumpectomy   -Invasive lobular carcinoma, histologic grade 2 (classic and pleomorphic),   pT3N0(sn), see synoptic report.   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   -Margins are negative for invasive and pleomorphic LCIS.   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   -Inked margins are negative for pleomorphic LCIS.   C: Left axillary sentinel lymph node, resection   -Two lymph nodes, negative for carcinoma (0/2).   -One lymph node with benign squamous lined cyst.    Wellbeing Assessment :   Patient unaccompanied by ***.   Transportation issue: None  Barriers to learning: None.  Emotional support given       Interventions:  Supportive care given. Personal difficulties related to treatment addressed to patient's satisfaction. Patient/family concerns addressed.     Education:  Contact information, AVS given and reviewed. Questions asked and answered to patient's satisfaction and understanding    Plan:  -OT***  -DME***  -Referral***  -f/u in ***

## 2020-11-18 ENCOUNTER — Encounter (HOSPITAL_COMMUNITY): Payer: Self-pay

## 2020-11-19 ENCOUNTER — Ambulatory Visit: Payer: Medicaid Other | Attending: Surgical Oncology | Admitting: Surgical Oncology

## 2020-11-19 ENCOUNTER — Ambulatory Visit
Admission: RE | Admit: 2020-11-19 | Discharge: 2020-11-19 | Disposition: A | Discharge status: Home / Self Care | Payer: Medicaid Other | Attending: Surgical Oncology | Admitting: Surgical Oncology

## 2020-11-19 ENCOUNTER — Encounter (HOSPITAL_BASED_OUTPATIENT_CLINIC_OR_DEPARTMENT_OTHER): Payer: Self-pay | Admitting: Surgical Oncology

## 2020-11-19 ENCOUNTER — Telehealth (HOSPITAL_BASED_OUTPATIENT_CLINIC_OR_DEPARTMENT_OTHER): Payer: Self-pay | Admitting: Surgical Oncology

## 2020-11-19 VITALS — BP 127/79 | HR 83 | Temp 96.2°F | Resp 16 | Ht 66.2 in | Wt 165.3 lb

## 2020-11-19 DIAGNOSIS — C50912 Malignant neoplasm of unspecified site of left female breast: Secondary | ICD-10-CM | POA: Insufficient documentation

## 2020-11-19 DIAGNOSIS — Z09 Encounter for follow-up examination after completed treatment for conditions other than malignant neoplasm: Secondary | ICD-10-CM

## 2020-11-19 MED ORDER — SULFAMETHOXAZOLE-TRIMETHOPRIM 800-160 MG OR TABS
1.0000 | ORAL_TABLET | Freq: Two times a day (BID) | ORAL | 0 refills | Status: DC
Start: 2020-11-19 — End: 2021-03-05

## 2020-11-19 MED ORDER — OXYCODONE HCL 5 MG OR TABS
5.0000 mg | ORAL_TABLET | Freq: Four times a day (QID) | ORAL | 0 refills | Status: DC | PRN
Start: 2020-11-19 — End: 2021-03-05

## 2020-11-19 NOTE — Progress Notes (Signed)
Surgical Oncology:        Demographics:  Date: November 19, 2020   Patient Name: Kelsey Rubio   Medical Record #: [id number]   DOB: 10/05/1975  Age: 45 year old  Sex: female    Interval History:    Kelsey Rubio is 45 year old female  with T3N0 ILC.  She presented with a palpable mass.  Imaging showed T2 mass core bx ILC.  Last mammogram 2 yrs ago was normal.  No fhx breast [place].  She declined genetic testing.  Breast mri shows 6cm disease.  She had a lump + sln bx    Surgical Oncologic History:    Stage at first diagnosis: lt T3N0 9/22  Surgical Procedure: p  Chemotherapy: p  Radiation: p    FINAL PATHOLOGIC DIAGNOSIS:   A: Left breast, lumpectomy   -Invasive lobular carcinoma, histologic grade 2 (classic and pleomorphic),   pT3N0(sn), see synoptic report.   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   -Margins are negative for invasive and pleomorphic LCIS.   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   -Inked margins are negative for pleomorphic LCIS.   C: Left axillary sentinel lymph node, resection   -Two lymph nodes, negative for carcinoma (0/2).   -One lymph node with benign squamous lined cyst.   BREAST CANCER: SYNOPTIC REPORT:   (CAP Version 4.5.0.0, posted June 2021, based on AJCC 8th Edition)   Specimen/Procedure:   Excision   Lymph Node Sampling:   Sentinel lymph nodes   Specimen Laterality:   Left   Tumor Focality:   Unifocal   Invasive Tumor Type:   Invasive lobular carcinoma (mixed of classic and pleomorphic)   Invasive Tumor Size:   53 mm (maximum diameter)   Histologic Grade (Nottingham modification, Scarf-Bloom-Richardson System):   Grade 2     Nuclear Pleomorphism: Score 3     Mitotic rate: Score 1     Glandular/Tubular Differentiation: Score 3     Total score: 7   Lymphovascular Invasion:   Absent   Resection Margins For Invasive Tumor:   Margins uninvolved by invasive carcinoma   Distance  from closest margin: Widely clear -greater than 10 mm; including   specimens B, which is negative for invasive carcinoma   Duct Carcinoma In Situ (DCIS):   Not identified   Lobular Carcinoma In Situ (LCIS):   Present   LCIS Type:   Classic and pleomorphic (nuclear grades 2 and 3), mixed   LCIS Extent:   Extensive. Pleomorphic LCIS is not seen at the inked margins   Lymph Nodes Positive /Total Lymph Nodes Sampled: 0/2   Number of lymph nodes with macrometastases: 0   Number of lymph nodes with micrometastases: 0   Number of Lymph Nodes with Isolated Tumor Cells: 0   Pancytokeratin is negative.   Treatment Effect(s): Response to Presurgical (Neoadjuvant) Therapy in the   Breast:   Not applicable (no known presurgical therapy)   Therapy-related changes in lymph node:   Not applicable   Pathologic Stage Classification:   Primary Tumor (Invasive Carcinoma) (pT):   pT3: Tumor >50 mm in greatest dimension   Regional Lymph Nodes (pN):   (sn):   Sentinel node(s) evaluated.   pN0: No regional lymph node metastasis identified or ITCs only   Nipple:   Not applicable   Skin/dermis:   Not applicable   Skeletal Muscle:   Not applicable   Breast biomarker testing  Testing was performed and results were reported on a prior biopsy   ([id number]); results are outlined again below:   Estrogen receptor (ER): Positive:   Percentage of cells with nuclear positivity: 90%   Average intensity of staining: Strong   Progesterone receptor (PR): Positive:   Percentage of cells with nuclear positivity: 90%   Average intensity of staining: Strong   HER2 (IHC): Negative (score 0, ASCO/CAP 2018)   HER2 FISH: Negative   Representative block(s) for potential ancillary testing and (percent   tumor): A5-A7 (50%)   Additional findings: Usual ductal hyperplasia, microcalcification.   Marland Kitchen      ER/PR/HER2 Status: ER/PR +, HER2 -, Ki-67 11%    Pathology  07/23/20 ([facility]              History  Menarche: 12  Menopause: Lmp  09/03/20  G5P3 2 AB-- age at first birth: 1  HRT/OCP: OCP x 10 yrs  Family History of Breast Miles: denies  Family History of Ovarian BV:QXIHWT  Genetic testing: denies  Ashkenazi Jewish heritage: denies  Prior breast biopsies/abnormal imaging (laterality and date):           REVIEW OF SYSTEMS   GEN: The patient has no weight loss, fevers, or chills  EYES:No change in vision.  ENT: No change in hearing. No epistaxis.   PULM: No dypnea, productive cough, or wheezing  CARDIO:  No chest pain, tachycardias, dyspnea on exertion.  GI: No jaundice, hematesesis, abdominal pain, diarrhea or constipation.  GU: No dysuria or hematuria  ENDO: No diabetes.  JOINT:  No new joint pains.  HEME/LYMPHATIC: No bleeding, anemias, or lymphadenopathy.  NEURO: No loss of consciousness, headaches.    Medications:  Current Outpatient Medications   Medication Sig   . acetaminophen (TYLENOL) 325 MG tablet Take 2 tablets (650 mg) by mouth every 4 hours as needed for Mild Pain (Pain Score 1-3).   Marland Kitchen albuterol 108 (90 Base) MCG/ACT inhaler INHALE 2 PUFFS BY MOUTH EVERY 4 TO 6 HOURS AS NEEDED   . Beclomethasone Diprop HFA (QVAR REDIHALER IN)    . docusate sodium (COLACE) 250 MG capsule Take 1 capsule (250 mg) by mouth 2 times daily.   Marland Kitchen gabapentin (NEURONTIN) 300 MG capsule Take 1 capsule (300 mg) by mouth 3 times daily.   . hydrochlorothiazide (HYDRODIURIL) 25 MG tablet Take 25 mg by mouth daily.   . montelukast (SINGULAIR) 10 MG tablet every evening.   Marland Kitchen oxyCODONE (ROXICODONE) 5 MG immediate release tablet Take 1 tablet (5 mg) by mouth every 6 hours as needed for Severe Pain (Pain Score 7-10).   . traMADol (ULTRAM) 50 MG tablet Take 1 tablet (50 mg) by mouth every 6 hours as needed for Moderate Pain (Pain Score 4-6).     No current facility-administered medications for this visit.       Allergies:  Allergies   Allergen Reactions   . Aspirin Other     Wheezing and stuffy nose   . Ibuprofen Other     Wheezing and stuffy nose         Physical  Exam:  BP 127/79 (BP Location: Right arm, BP Patient Position: Sitting, BP cuff size: Regular)   Pulse 83   Temp 96.2 F (35.7 C) (Temporal)   Resp 16   Ht 5' 6.2" (1.681 m)   Wt 75 kg (165 lb 5.5 oz)   SpO2 100%   BMI 26.53 kg/m    General Appearance: The patient is an  alert, cooperative female in no acute distress.  SKIN: No lesions  HEENT: PERRLA, EOMs are intact.  The orophynx is clear.   LYMPH NODES: No palpable supra clavicular nodes.  CHEST: The lungs are clear to auscultation.  BREAST: large breast bilateral rt breast no masses no ln, lt breast well healing inc slight pink thickening in axilla no ln palp  HEART: S1 and S2 are normal.  There are no murmurs or extrasounds.  ABDOMEN:  Liver and spleen are not palpable.  EXTREMITES: There is no edema or cyanosis. Joints are normal without redness or swelling.  NEURO:  Mental status is normal.  No focal neurologic findings.    Assessment/Plan:  In summary this patient is a 45 year old female with T2N0 ILC  1. U/s  Axilla look for seroma  2. Bactrim   3. Her tumor is Estrogen sensitive therefore we discussed sending an oncotype dx to see if her tumor would respond to IV chemo.  She agrees if she has a high score she would consider IV chemo.  She will follow up with medical oncology.  It is a medical necessity to decide if she needs chemo. NCCN Guidelines recommend Oncotype DX for this pt s/t ER+, HER2-, lymph node neg, invasive breast cancer. Results from this test will be used to determine if patient would benefit from receiving chemotherapy. Oncotype DX is being ordered per NCCN protocol and is a medical necessity. Results will be reviewed by interdisiplinary team and will influence future POC.  4. Refer to meedonc and radonc for adjvuant tx  5. F/u 10 days

## 2020-11-19 NOTE — Telephone Encounter (Signed)
Pt calling stating she is running late for her 1pm appt and not sure if she can still be seen or re-schedule for Monday. Pt requesting a call back from Rupert as she has a question for her.

## 2020-11-19 NOTE — Interdisciplinary (Signed)
Post-op follow up appointment with Dr. Benjie Karvonen   Exam done and records reviewed.   Ambulatory. Alert and oriented x 3    Nursing assessment:   45 year old old female with breast cancer hx s/p Left image guided lumpectomy, sentinel node biopsy on 11/10/20    Side effects reported: None  Surgery site: pain in left axilla, with hard spot, redness and warmth below breast incision  Pain: denies  Constipation: denies  Fatigue: denies  Appetite: Good   ROM: Good  Drains:n/a    Pathology result reviewed with the patient by Dr. Benjie Karvonen  FINAL PATHOLOGIC DIAGNOSIS:   A: Left breast, lumpectomy   -Invasive lobular carcinoma, histologic grade 2 (classic and pleomorphic),   pT3N0(sn), see synoptic report.   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   -Margins are negative for invasive and pleomorphic LCIS.   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   -Inked margins are negative for pleomorphic LCIS.   C: Left axillary sentinel lymph node, resection   -Two lymph nodes, negative for carcinoma (0/2).   -One lymph node with benign squamous lined cyst.    Wellbeing Assessment :   Patient accompanied by daughter  Transportation issue: None  Barriers to learning: None.  Emotional support given       Interventions:  Supportive care given. Personal difficulties related to treatment addressed to patient's satisfaction. Patient/family concerns addressed.     Education:  Contact information, AVS given and reviewed. Questions asked and answered to patient's satisfaction and understanding    Plan:  - left breast u/s today to r/o seroma  -Referral to rad onc [place]  -referral to med   -f/u in  10 days  - bactrim ordered  - oxycodone refilled  - oncotype testing, consent signed

## 2020-11-19 NOTE — Telephone Encounter (Signed)
Spoke with pt and she is able to make it by 2 pm if she left now. She wants to be sure she can be seen if late, before she leaves. Informed her she can be seen at 2 pm

## 2020-11-23 ENCOUNTER — Telehealth (HOSPITAL_BASED_OUTPATIENT_CLINIC_OR_DEPARTMENT_OTHER): Payer: Self-pay

## 2020-11-23 DIAGNOSIS — C50412 Malignant neoplasm of upper-outer quadrant of left female breast: Secondary | ICD-10-CM

## 2020-11-23 NOTE — Telephone Encounter (Signed)
Oncotype Order Info:    Tumor Type - Breast - Invasive Breast Cancer  Laterality: Left  Node Status: 0/2  Invasive Tumor Size: 53 mm  Estrogen Receptor Status: +  Progesterone Receptor Status: +  HER2 Status: -    Specimen ID: [id number]  Date of Collection: 11/10/20    Diagnosis: Malignant neoplasm of upper-outer quadrant of left breast in female, estrogen receptor positive  CPT: C50.412    Insurance: [employer]  ID #: [id number]    Oncotype DX education provided by surgical oncologist. Per MD, pt meets guidelines for testing, and has confirmed that she is open to receiving chemotherapy if recommended by medical personnel.    Pt has signed Anatomic Pathology Waiver Release (APWR), required by pathology dept.    Documentation required for Oncotype DX submission compiled & submitted to [facility] and [facility] by Gayleen Orem, LVN.      Oncotype Order # [id number].    Order for Invasive Breast Cancer Panel-[facility] submitted.    Ordering Physician: Dr. Dayton Scrape Medical Oncologist: pending  Med Oncologist appt: pending    LVN to remain available prn.

## 2020-11-23 NOTE — Telephone Encounter (Signed)
-----   Message from Milana Na, RN sent at 11/19/2020  3:48 PM PST -----  Regarding: oncotype and med onc referral  Hi Team,     This patient is S/P Left image guided lumpectomy, sentinel node biopsy on 11/10/20    Surgical Pathology results reviewed with patient today, 11/19/20    Oncotype DX consent form signed by patient and sent for scanning today, 11/19/20. Patient is aware that results can take up to one month.     Med/onc referral placed, patient aware that AA will call her with an appointment.   Rad/onc referral placed, patient aware that someone will call to schedule an appointment     Thanks,   [name]

## 2020-11-25 MED ORDER — TRAMADOL HCL 50 MG OR TABS
50.0000 mg | ORAL_TABLET | Freq: Four times a day (QID) | ORAL | 0 refills | Status: DC | PRN
Start: 2020-11-25 — End: 2021-03-05

## 2020-11-25 NOTE — Telephone Encounter (Signed)
Received VM message from patient stating she is still having sharp pains in her breast. Taking gabapentin. Helps with the nerve pain. Still has a lot of pain underneath her arm. Fluid seems like it is going away but it is still there. Doesn't want to take anymore Oxycodone because it is giving her a HA now when she takes it. Still has some left. Will Dr. Benjie Karvonen send in Tramadol since it is lighter to help with the pain under her arm? . Please call back.

## 2020-11-25 NOTE — Telephone Encounter (Signed)
Tissue specimen received by [facility] labs on 11/15    Testing is currently in progress.      Report ETA 11/29.

## 2020-11-30 NOTE — Interdisciplinary (Deleted)
45 year old patient with breast cancer here for 3 month follow up with Dr Benjie Karvonen    Surgical Oncologic History:  Diagnosis:left breast [place]  Surgery:Left image guided lumpectomy, sentinel node biopsyon 11/10/20  XRT:p  Systemic therapy:p appt with [facility] due to referral pending    Imaging    Nursing assessment  Pt ***unaccompanied by ***  Alert and oriented x 4  ***Denies breast related complaints    Wellbeing Assessment :   Transportation issue: None  Barriers to learning: None.  Emotional support given       Interventions:  Supportive care given. Personal difficulties related to treatment addressed to patient's satisfaction. Patient/family concerns addressed.     Education:  Contact information, AVS given and reviewed. Questions asked and answered to patient's satisfaction and understanding    Plan:  -Mammo with RV in ***

## 2020-11-30 NOTE — Patient Instructions (Signed)
[facility]  PHONE LIST FOR PATIENTS    Hours of operation:  Monday-Friday 8:00 - 4:30pm. Closed Holidays and Weekends    AFTER HOURS EMERGENCY NUMBER:    For urgent questions after hours, or on weekends/holidays, contact the [place] paging operator at [phone], and ask to speak to the doctor on call for Dr. Sarah Blair. Ask for On-Call Medical Oncologist for medical symptoms    [facility] front desk at: [phone]    For surgery scheduling, insurance or disability questions, please call,  Administrative Assistant Lori Morgan:Phone: [phone], Fax: [phone]    For nursing questions during weekdays, please call:   [name] Gudoy , RN, BSN, Nurse Case Manager at [phone]    Social Worker:   Brooke Mullineaux, LCSW - Phone: [phone] or Laurie Knight, BS, MSW, LCSW  - Phone: [phone]    [facility] Useful Phone Numbers     Imaging Scheduling Number: [phone]     Nuclear Medicine Bone Scan: [phone]     Scheduling Line (medical oncology, plastic surgery and breast surgery): [phone]     [place] [facility]: [phone]     [place] [facility]: [phone] or [phone]    [place] [facility]: [phone]     [place] [facility]: [phone]      Occupational Therapy: [phone]     Gynecology Oncology: [phone]     Family Center Genetics: [phone]     Plastic Surgery: [phone]    Information Desk: [phone]    General information, directions, phone numbers, registration and other available services     Financial Counselors: [phone] at [facility] [phone] or [phone]   Marisela, M[facility] Financial Counselor: [phone] [email or web address]    Insurance Contracting: [phone]   MED CENTER Billing: [phone]   MED GROUP (Physician) Billing: [phone]       Helpful Resources      Patient and Family Resource Center - Open daily; hours vary   Location: [facility], Room 1066, on the first floor just to the left of the lobby elevators   Phone [phone]     [facility] support groups (open to any patients in [place] area:) [email or web address]      American Cancer Society Breast [place] support groups: https://www.cancer.org/treatment/support-programs-and-services.html       Stage IV Zoom Breast Cancer Support group meets twice monthly. Sign up with the following link: Https://[email or web address]/patients/[email or web address]/default.aspx?trumbaEmbed=filterview%3Dcancerdefault%26template%3Dlist     Use this link to learn more about breast cancer as a diagnosis.   http://www.[facility].com/info/general_health/breast_care/guide_breast_ca_dx_tx.pdf     Use this link to learn more about advance directives.  https://prepareforyourcare.org/content/default/common/documents/PREPARE-Pamphlet-Flat-English.pdf       Tell Us How We Did During Your Consultation/Follow Up Visit...     It is our priority to make sure that we are providing excellent patient care.    Your feedback goes a long way and makes a difference. If you would like to provide us feedback on how we did, you can contact the Patient Experience 'We Listen' Department via telephone, Email, or by mail. Below you will find the contact information for Patient Experience 'We Listen'     E-mail: [email or web address]   Phone: [phone]    Mail:   Patient Experience   [facility]    [address], Mail [address]    [address]        Thank you for the opportunity to care for you during this time.

## 2020-11-30 NOTE — Telephone Encounter (Signed)
Still waiting for referral authorization to be approved

## 2020-11-30 NOTE — Interdisciplinary (Deleted)
Occupational Therapy Evaluation  Ordering Physician: Remer Macho    Visit Diagnosis:  No diagnosis found.  Visit Number:    Visits Remaining on current referral: 12 out of 12    Insurance: [employer]    Past Medical History:   Diagnosis Date   . Asthma    . Hypertension    . Palpitations        No past surgical history on file.    Allergies: Aspirin and Ibuprofen    Current Outpatient Medications   Medication Sig Dispense Refill   . acetaminophen (TYLENOL) 325 MG tablet Take 2 tablets (650 mg) by mouth every 4 hours as needed for Mild Pain (Pain Score 1-3). 60 tablet 0   . albuterol 108 (90 Base) MCG/ACT inhaler INHALE 2 PUFFS BY MOUTH EVERY 4 TO 6 HOURS AS NEEDED     . Beclomethasone Diprop HFA (QVAR REDIHALER IN)      . docusate sodium (COLACE) 250 MG capsule Take 1 capsule (250 mg) by mouth 2 times daily. 30 capsule 0   . gabapentin (NEURONTIN) 300 MG capsule Take 1 capsule (300 mg) by mouth 3 times daily. 90 capsule 0   . hydrochlorothiazide (HYDRODIURIL) 25 MG tablet Take 25 mg by mouth daily.     . montelukast (SINGULAIR) 10 MG tablet every evening.     Marland Kitchen oxyCODONE (ROXICODONE) 5 MG immediate release tablet Take 1 tablet (5 mg) by mouth every 6 hours as needed for Severe Pain (Pain Score 7-10). 20 tablet 0   . oxyCODONE (ROXICODONE) 5 MG immediate release tablet Take 1 tablet (5 mg) by mouth every 6 hours as needed for Severe Pain (Pain Score 7-10). 20 tablet 0   . sulfamethoxazole-trimethoprim (BACTRIM DS, SEPTRA DS) 800-160 MG tablet Take 1 tablet by mouth 2 times daily. 20 tablet 0   . traMADol (ULTRAM) 50 MG tablet Take 1 tablet (50 mg) by mouth every 6 hours as needed for Moderate Pain (Pain Score 4-6). 10 tablet 0     No current facility-administered medications for this visit.     Start of Care:     Interpreter Services: {INTERPRETERSERVICES:31948::"N/A"}    SUBJECTIVE    Onset date: ***  Mechanism of injury: ***  History of presenting condition:   This is a 45 yo female with hx of ILC  with left breast lumpectomy , left breast re- excision ,resection ,LSLNB ,resection (0/2) on 11/10/2020 .Patient with U/s axilla look seroma and is referred for adjuvant chemo and radio onc   Symptom progression since onset: ***    Pain levels:  {Subjective Pain on Evaluation:33421}    Previous treatment for condition: ***  Diagnostic tests: {Imaging:35466}  Occupation: ***  Employment Status: {Employment Status:35468}  Prior level of function: {ZSWF:09323}  Functional limitations: ***  Fall history: {YES:13081}  Home Environment: ***  Patient goals:  ***  Other subjective/PMH:  ***  Therapy Precautions/Contraindications: {Therapy Precautions/Contraindications:31288}    Dominant Side: {Right/Left/ambidextrous:30628}    Social History  Living Situation: ***  Leisure Activity/Exercise: ***      General ADL/Self-Care Assistance Needs: {General ADL Self Care:35470}    Cancer History: {Cancer History:35465}    A trained chaperone was present for this exam/procedure {YES/NO:63}.   Name of Chaperone Present: ***  If a Chaperone was not present please indicate the reason:{Chaperone Reason:29085::"Patient Declined"}     The patient has elected to continue with their chaperone preference indicated above throughout the episode of care. The patient was informed that they  are free to change their chaperone preference at any point during their care with [place].       OBJECTIVE    {OT Lymph Outcome Measures:35317}    Areas Affected  Superior Areas: {Superior Areas:34905}  Right Upper Extremity: {Upper Extremity:34906}  Left Upper Extremity: {Upper Extremity:34906}  Right Lower Extremity: {Lower Extremity:34907}  Left Lower Extremity: {Lower Extremity:34907}  Trunk: {Trunk:34908}    Observations  Weight: ***  Presents with: {Presents VWPV:94801}    Skin Condition  Skin Condition: {Skin Condition:34910}  Other Skin Condition Findings: ***  Lymphedema Stage (Foldi Scale): {Lymphedema [place] KPVVZ:48270}  MD [facility] ([place])  Head and Neck Lymphedema: {MD [facility]:34912}  Wound Care: ***  Other Lymphedema Findings: ***    Axillary Web Syndrome  Cording Present: {Cording Present:34913}  Location: {Location:34914}  Grade: {Grade:34915}  Number of Cords: {Number of Cords:34916}  Identification: {BEMLJQGBEEFEOF:12197}  Depth: {Depth:34918}  Width: {Width:34919}   ROM measurements : AROM right UE shoulder flexion and right shoulder abduction , Left UE shoulder flexion and left shoulder abduction.    Circumferential Measurements:  Date       Area Measured Unaffected Limb Affected Limb                                                                                                                                       {Treatment Provided JOITG:54982}    ASSESSMENT  Rehabilitation Potential: {- Therapy Prognosis -:31206}    Patient presents with ***. Deficits include ***, limiting the patient from ***.  Patient will benefit from skilled therapy to address impairments and improve function. Patient was given a home exercise program to begin addressing evaluation findings and was educated on how to perform effectively and safely. Patient was instructed to perform home exercise program as tolerated. Discussed evaluation findings with patient and educated on therapy potential and plan of care.    Plan for next visit: ***    Goals                                                   PLAN                                                 Therapist and {- Treatment Plan Discussion -:31203::"Patient"} discussed treatment plan for No diagnosis found. and all parties agreeable to the following recommendations.    Planned Therapy Interventions: {- Planned Therapy Interventions -:31213}    Goals of treatment reviewed with Ivin Booty? {PTOTYesNo:32196::"Yes"}  Home Exercise Program issued? {PTOTYesNo:32196::"Yes"}  Education provided: {- Education -:31205::"Home Exercise Program"}

## 2020-12-02 ENCOUNTER — Ambulatory Visit (HOSPITAL_BASED_OUTPATIENT_CLINIC_OR_DEPARTMENT_OTHER): Payer: Medicaid Other | Admitting: Rehabilitative and Restorative Service Providers"

## 2020-12-07 ENCOUNTER — Ambulatory Visit (HOSPITAL_BASED_OUTPATIENT_CLINIC_OR_DEPARTMENT_OTHER): Payer: Medicaid Other | Admitting: Surgical Oncology

## 2020-12-07 NOTE — Telephone Encounter (Signed)
Placed call to pt, pt did not answer left brief message letting pt know we have received referral and to schedule new pt consult.

## 2020-12-07 NOTE — Telephone Encounter (Signed)
Confirmed new patient consultation with Dr. Aurea Graff for 12/7 @ 7am.    Has the patient been notified of:  Time of appt- yes  Date of appt- yes  Provider- yes  Location- yes  Visitor policy- yes    Requested interp- Not needed

## 2020-12-08 ENCOUNTER — Other Ambulatory Visit (HOSPITAL_BASED_OUTPATIENT_CLINIC_OR_DEPARTMENT_OTHER): Payer: Self-pay | Admitting: Surgical Oncology

## 2020-12-08 DIAGNOSIS — C50919 Malignant neoplasm of unspecified site of unspecified female breast: Secondary | ICD-10-CM

## 2020-12-15 ENCOUNTER — Telehealth (HOSPITAL_BASED_OUTPATIENT_CLINIC_OR_DEPARTMENT_OTHER): Payer: Self-pay

## 2020-12-15 NOTE — Telephone Encounter (Signed)
Patient called to cancel appointment for 12/16/2020 due to not having transportation. Appointment is now cancelled, please call back to reschedule NP consult.  Thanks

## 2020-12-15 NOTE — Telephone Encounter (Signed)
Returned patients call to reschedule Consult, received no answer left message to call back to schedule.

## 2020-12-16 ENCOUNTER — Ambulatory Visit (HOSPITAL_BASED_OUTPATIENT_CLINIC_OR_DEPARTMENT_OTHER): Payer: Medicaid Other | Admitting: Hematology & Oncology

## 2021-01-22 ENCOUNTER — Other Ambulatory Visit: Payer: Self-pay

## 2021-01-29 ENCOUNTER — Ambulatory Visit (HOSPITAL_BASED_OUTPATIENT_CLINIC_OR_DEPARTMENT_OTHER): Payer: Medicaid Other | Admitting: Hematology & Oncology

## 2021-01-29 ENCOUNTER — Encounter (HOSPITAL_BASED_OUTPATIENT_CLINIC_OR_DEPARTMENT_OTHER): Payer: Self-pay

## 2021-01-29 ENCOUNTER — Encounter (INDEPENDENT_AMBULATORY_CARE_PROVIDER_SITE_OTHER): Payer: Self-pay | Admitting: Surgical Oncology

## 2021-01-29 ENCOUNTER — Telehealth (HOSPITAL_BASED_OUTPATIENT_CLINIC_OR_DEPARTMENT_OTHER): Payer: Self-pay | Admitting: Hematology & Oncology

## 2021-01-29 DIAGNOSIS — C50919 Malignant neoplasm of unspecified site of unspecified female breast: Secondary | ICD-10-CM

## 2021-01-29 NOTE — Telephone Encounter (Signed)
Called pt as it was past 8am for her appt with DR Aurea Graff and pt informed me she is not coming and prefers to see a Dr closer to her home. Pt then asked to be re-scheduled and informed her will send a message to [facility] to reach out to her.

## 2021-01-29 NOTE — Progress Notes (Deleted)
MEDICAL ONCOLOGY INITIAL CONSULTATION NOTE    VISIT DATE: 01/29/2021    REASON FOR REFERRAL: left breast cancer; discuss adjuvant therapy    REFERRING PHYSICIAN: Remer Macho   [facility] communication will be sent to referring provider and PCP    DEMOGRAPHICS:     Medical Record #: [id number]    DOB: 03-Sep-1975    Age: 46 year old    TREATMENT SUMMARY    Date of diagnosis: 09/2020    Diagnosis:  Stage IB, pT3N0(sn), invasive lobular carcinoma of the Left breast, grade 2, ER 3+ 90%, PR 3+ 90%, HER2 IHC 0, FISH negative; Oncotype RS=8    Local therapy:  Left breast lumpectomy and sentinel node biopsy (11/10/2020)    Systemic therapy: TBD    Surgical Oncologist: Tenna Delaine    Radiation Oncologist:  ? [facility]    Current Primary Care Physician:  Alvera Singh    HISTORY OF PRESENT ILLNESS:  Kelsey Rubio is a 46 year old woman with stage IB, grade 2, HR+ HER2- Left breast invasive lobular carcinoma, s/p left lumpectomy and sentinel node biopsy (11/10/2020), who is referred here to discuss adjuvant systemic therapy recommendation.     Today, pt is here ***    On rest of review of systems, no fevers, chills, headaches, chest pain/pressure, dyspnea, nausea, abdominal pain, changes in urinary or bowel habits, or new bone pain.      BREAST CANCER HISTORY:    Self palpated mass  06/25/2020: Diag BL MG: asym in lower R breast;    Area of architectural distortion in L breast    BL breast US: normal tissue with fat lobules    In R breast; L breast 2:00 N+9 with irreg    Hypoechoic mass 2.61 x 1.51 x 1.57cm.     Noraml left axillary LNs  07/23/2020: Left breast 2:00 [place] core bx: ILC, 63m    ER +3 91-100%, PR 3+ 90%, HER2 IHC 0    HER FISH negative (ratio 1.1, copy 1.8)    Ki67 10%    +multifocal LCIS  09/30/2020: MRI bl breast: known ILC in L breast 2:00    Measures 6.2 x 3.7 x 2.3cm; no LAD.  10/07/2020: Diag BL MG and L [place] irreg mass at    2:00 L breast, measuring 2.0 x 1.3 x 2.0cm,     No LAD  11/10/2020: Left lumpectomy and  sentinel node resection: pT3N0(sn),     ILC, g2,     Extensive LCIS, classic and pleomorphic    Margins are neg for invasive and pleomorphic LCIS    Left breast re-excision resection    BREAST CANCER RISK FACTORS:   Menarche 12  LMP: No LMP recorded. 09/04/2020?  Pregnancies G5 P3   --Age at first birth: 89 Prior use of hormonal Birth Control: OCP x 89yr Prior use of HRT: no  Ashkenazi Jewish Heritage: no  Family history of breast cancer: no  Family history of other cancer: no  Prior genetic testing: no; declined genetic testing    Current Outpatient Medications   Medication Sig   . acetaminophen (TYLENOL) 325 MG tablet Take 2 tablets (650 mg) by mouth every 4 hours as needed for Mild Pain (Pain Score 1-3).   . Marland Kitchenlbuterol 108 (90 Base) MCG/ACT inhaler INHALE 2 PUFFS BY MOUTH EVERY 4 TO 6 HOURS AS NEEDED   . Beclomethasone Diprop HFA (QVAR REDIHALER IN)    . docusate sodium (COLACE) 250 MG capsule Take 1 capsule (  250 mg) by mouth 2 times daily.   Marland Kitchen gabapentin (NEURONTIN) 300 MG capsule Take 1 capsule (300 mg) by mouth 3 times daily.   . hydrochlorothiazide (HYDRODIURIL) 25 MG tablet Take 25 mg by mouth daily.   . montelukast (SINGULAIR) 10 MG tablet every evening.   Marland Kitchen oxyCODONE (ROXICODONE) 5 MG immediate release tablet Take 1 tablet (5 mg) by mouth every 6 hours as needed for Severe Pain (Pain Score 7-10).   Marland Kitchen oxyCODONE (ROXICODONE) 5 MG immediate release tablet Take 1 tablet (5 mg) by mouth every 6 hours as needed for Severe Pain (Pain Score 7-10).   Marland Kitchen sulfamethoxazole-trimethoprim (BACTRIM DS, SEPTRA DS) 800-160 MG tablet Take 1 tablet by mouth 2 times daily.   . traMADol (ULTRAM) 50 MG tablet Take 1 tablet (50 mg) by mouth every 6 hours as needed for Moderate Pain (Pain Score 4-6).     No current facility-administered medications for this visit.     ALLERGIES: is allergic to aspirin and ibuprofen.    Past Medical History:   Diagnosis Date   . Asthma    . Hypertension    . Palpitations    No history of DVT, PE,  thrombotic stroke, TIA, or known inherited clotting disorders.  No history of cardiovascular disease.  No history of bone fractures or osteoporosis.  Latest DEXA scan: none    No past surgical history on file.    Social History     Socioeconomic History   . Marital status: Single   Tobacco Use   . Smoking status: Never   . Smokeless tobacco: Never   Substance and Sexual Activity   . Alcohol use: Yes     Alcohol/week: 2.0 standard drinks     Types: 2 Glasses of wine per week     Comment: once a week   . Drug use: Never   Living situation:    No family history on file.    PHYSICAL EXAM:  There were no vitals taken for this visit.  ECOG Performance Status: 0  GEN: well appearing, alert, pleasant, in no distress  HEENT: mmm, scleral anicteric   CV:  RRR, nl S1 S2, no murmurs  PULM: CTAB  ABD: Soft, non-distended, non-tender  EXT: No edema  SKIN: No jaundice, rash  LN: No cervical, supraclavicular, axillary lymphadenopathy.  RIGHT BREAST:   LEFT BREAST:      LABS:    10.9* \ 14.4 / 327 %S: 47  %B: --    / 40.8 \  %L: 34  %M: 12   09/08 1542 %E: 5      Na 140 (09/08) CL 99 (09/08) BUN 14 (09/08) GLU   91 (09/08)   K 3.5 (09/08) CO2 28 (09/08) Cr 0.88 (09/08)      TP   ALT   TBILI   ALK PHOS      ALB   AST   DBILI        IMAGING:    MRI bl breast, 09/30/20  FINDINGS:  The technical quality of this study is considered adequate to make a final assessment and recommendation.  There is heterogeneous fibroglandular tissue bilaterally and moderate background enhancement.  Coronal T2 sequences demonstrates normal bilateral axillary nodes.  Axial T2 sequence demonstrates no large cysts or fluid collections.  Non fat saturated T1 sequence demonstrates no fat containing lesions. Signal void in the left breast at site of biopsy marking clip seen on mammogram.  High resolution post contrast sequence demonstrates no internal  mammary lymphadenopathy.  Regarding the right breast: There are no masses, architectural distortion or suspicious  areas of enhancement.  Regarding the left breast: Known invasive lobular carcinoma in the left breast is seen as an irregular mass with spiculated and associated focal non-mass enhancement at the 2 o'clock position, 9 cm from the nipple measuring 6.2 x 3.7 x 2.3 cm (post contrast dynamic image 102). Internal enhancement is heterogeneous. Kinetic assessment demonstrates fast initial enhancement followed by washout in the delayed portions of the curve.  IMPRESSION:  1.Known invasive lobular carcinoma in the left breast at 2:00 measures 6.2 x 3.7 x 2.3 cm.  2. No lymphadenopathy.    Recommendations: Diagnostic mammogram and left breast ultrasound are scheduled for 10/07/2020.    BI-RADS category 1, negative right breast.    BI-RADS category 6, known biopsy proven malignancy left breast.    Diag MG and [place], 10/07/20  DIAGNOSTIC MAMMOGRAM WITH DIGITAL BREAST TOMOSYNTHESIS FINDINGS:  The breasts are heterogeneously dense, which may obscure small masses.    In the right breast there are no masses, asymmetries, or suspicious calcifications.    There is an irregular mass with spiculated margins and associated marker clip in the left breast at 2 o'clock located 9 centimeters from the nipple.    ULTRASOUND FINDINGS:  Sonography was performed in the left breast in transverse and longitudinal planes. Ultrasound demonstrates an irregular mass measuring 2.0 x 1.3 x 2.0 cm in the left breast at 2 o'clock located 9 centimeters from the nipple.  The mass is poorly delineated on mammography and ultrasound, and is known to be substantially larger than these ultrasound measurements based on 09/30/2020 MRI.    No axillary lymphadenopathy identified.    IMPRESSION / RECOMMENDATION:  There is no evidence of malignancy in the right breast.  Mass in the left breast is a known malignancy.  Clinical management of known malignancy is recommended.  ASSESSMENT:  BI-RADS Category 6:  Known Biopsy Proven Malignancy      PATHOLOGY:    FINAL  PATHOLOGIC DIAGNOSIS:   Outside slides received [facility],   [place], [place]; "[phone]"; 07/23/2020):   A: Left breast, 2:00 o'clock, core biopsy (A):   -Invasive lobular carcinoma, provisional histologic (mBR) grade 2, see   comment.   -Lobular carcinoma in situ, classic type.   Breast Needle Biopsy Tumor Synoptic Report:   Invasive tumor type: Invasive lobular carcinoma   Invasive tumor size: 13 mm (largest dimension in a core, multiple cores   involved)   Provisional histologic grade (Nottingham modification,   Scarf-Bloom-Richardson): Grade 2   Nuclear Pleomorphism: Score 3   Mitotic rate: Score 1   Glandular/Tubular Differentiation: Score 3   Total score: 7   Ductal Carcinoma In Situ: Not identified   Ancillary Studies: A review of the outside immunohistochemical slides, with   their appropriately reactive controls, showed the tumor to display the   following immunoprofile:   Estrogen receptor (ER): Positive:   Percentage of cells with nuclear positivity: 91-100%   Average intensity of staining: Strong   Progesterone receptor (PR): Positive:   Percentage of cells with nuclear positivity: 90%   Average intensity of staining: Strong   Her2/neu (IHC):   Negative (score 1+, ASCO/CAP 2018)   ADDENDUM:   IMMUNOHISTOCHEMISTRY RESULTS   [Performed on Unstained Slides Cut and Prepared at [place] on Outside Block   A1]:   Estrogen receptor (ER): Positive:   Percentage of cells with nuclear positivity: 90%   Average intensity  of staining: Strong   Progesterone receptor (PR): Positive:   Percentage of cells with nuclear positivity: 90%   Average intensity of staining: Strong   HER2 (IHC): Negative (score 0, ASCO/CAP 2018)   HER2 (FISH): Pending   ADDENDUM:   HER2 FISH RESULTS:   [Performed on Unstained Slides Cut and Prepared at [place] from Outside Block   A1]   FINAL RESULT: HER2 NEGATIVE   HER2:CEP17 signal ratio = 1.1   Average number of HER2 signals per nucleus = 1.8   Average number of CEP17  signals per nucleus = 1.6     Left lumpectomy and sentinel node resection, 11/10/2020  FINAL PATHOLOGIC DIAGNOSIS:   A: Left breast, lumpectomy   -Invasive lobular carcinoma, histologic grade 2 (classic and pleomorphic),   pT3N0(sn), see synoptic report.   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   -Margins are negative for invasive and pleomorphic LCIS.   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   -Inked margins are negative for pleomorphic LCIS.   C: Left axillary sentinel lymph node, resection   -Two lymph nodes, negative for carcinoma (0/2).   -One lymph node with benign squamous lined cyst.   BREAST CANCER: SYNOPTIC REPORT:   (CAP Version 4.5.0.0, posted June 2021, based on AJCC 8th Edition)   Specimen/Procedure:   Excision   Lymph Node Sampling:   Sentinel lymph nodes   Specimen Laterality:   Left   Tumor Focality:   Unifocal   Invasive Tumor Type:   Invasive lobular carcinoma (mixed of classic and pleomorphic)   Invasive Tumor Size:   53 mm (maximum diameter)   Histologic Grade (Nottingham modification, Scarf-Bloom-Richardson System):   Grade 2     Nuclear Pleomorphism: Score 3     Mitotic rate: Score 1     Glandular/Tubular Differentiation: Score 3     Total score: 7   Lymphovascular Invasion:   Absent   Resection Margins For Invasive Tumor:   Margins uninvolved by invasive carcinoma   Distance from closest margin: Widely clear -greater than 10 mm; including   specimens B, which is negative for invasive carcinoma   Duct Carcinoma In Situ (DCIS):   Not identified   Lobular Carcinoma In Situ (LCIS):   Present   LCIS Type:   Classic and pleomorphic (nuclear grades 2 and 3), mixed   LCIS Extent:   Extensive. Pleomorphic LCIS is not seen at the inked margins   Lymph Nodes Positive /Total Lymph Nodes Sampled: 0/2   Number of lymph nodes with macrometastases: 0   Number of lymph nodes with  micrometastases: 0   Number of Lymph Nodes with Isolated Tumor Cells: 0   Pancytokeratin is negative.   Treatment Effect(s): Response to Presurgical (Neoadjuvant) Therapy in the   Breast:   Not applicable (no known presurgical therapy)   Therapy-related changes in lymph node:   Not applicable   Pathologic Stage Classification:   Primary Tumor (Invasive Carcinoma) (pT):   pT3: Tumor >50 mm in greatest dimension   Regional Lymph Nodes (pN):   (sn):   Sentinel node(s) evaluated.   pN0: No regional lymph node metastasis identified or ITCs only   Nipple:   Not applicable   Skin/dermis:   Not applicable   Skeletal Muscle:   Not applicable   Breast biomarker testing   Testing was performed and results were reported on a prior biopsy   ([id number]); results are outlined again below:  Estrogen receptor (ER): Positive:   Percentage of cells with nuclear positivity: 90%   Average intensity of staining: Strong   Progesterone receptor (PR): Positive:   Percentage of cells with nuclear positivity: 90%   Average intensity of staining: Strong   HER2 (IHC): Negative (score 0, ASCO/CAP 2018)   HER2 FISH: Negative   Representative block(s) for potential ancillary testing and (percent   tumor): A5-A7 (50%)   Additional findings: Usual ductal hyperplasia, microcalcification.       OncotypeDX      RS Clin        ASSESSMENT & PLAN:          Kelsey Rubio is a lovely 46 year old woman with stage IB, grade 2, HR+ HER2- Left breast invasive lobular carcinoma, s/p left lumpectomy and sentinel node biopsy (11/10/2020), who is referred here to discuss adjuvant systemic therapy recommendation.          History, presentation, pathology reports, imaging, and labs were reviewed.  Pt recovered very well from lumpectomy and SLNB. She has a good performance status (ECOG 0) with well controlled medical co-morbidities.          We had a detailed discussion regarding the stage, histology, natural history, prognosis, and recurrence risk of her early  breast cancer.  We discussed that there are two main treatment modalities for early stage, node negative, ER/PR positive HER2 negative breast cancer with a low OncotypeDX recurrence score: (1) local treatment with surgery and radiation to reduce risk of locoregional recurrence; and (2) systemic treatment with endocrine (anti-estrogen) therapy to reduce risk of locoregional and systemic recurrence. Due to low recurrence score, she will not benefit from adjuvant chemotherapy.              We then moved on to discussion of adjuvant endocrine treatment to prevent cancer recurrence in the ipsilateral breast or new development of invasive cancer in the contralateral breast.  We discussed that with endocrine therapy, she will have an absolute recurrence risk reduction of 11% and an overall survival improvement of about 5% over the next 10 yrs using the RSClin and NHS Predict Tool, respectively.  We also discussed that there are two main choices of endocrine therapy for post-menopausal patients.  One choice is tamoxifen and the other is aromatase inhibitors (AIs) such as anastrozole, letrozole, or exemestane.  In postmenopausal women, AIs has been shown to be more superior compared to tamoxifen based on a number of large clinical trials such as the BIG 1-98 which was also confirmed in a recent meta-analysis of 9 phase 3 clinical trials [Lancet 2015; 386(10001):1341-1352].             We briefly discussed serious but rare side effects of tamoxifen include thromboembolic events (DVT, PE, stroke) and endometrial cancer were discussed with patient.  Other side effects associated with the use of tamoxifen include and not limited to hot flashes, vaginal dryness or discharge, menstrual irregularities, sexual dysfunction, ovarian cysts, mood changes, depression, fatty liver disease, and cataracts.            We then briefly discussed the serious side effects associated with aromatase inhibitors include loss of bone density,  fractures, hyperlipidemia, and increase cardiovascular events.  Other side effects of aromatase inhibitors include and not limited to joint pain and stiffness, muscle aches, carpal tunnel syndrome, hyperlipidemia, hair thinning, hot flashes and sexual dysfunction.  Informational handout on letrozole was given to pt.  Finally, I discussed that despite the fact that endocrine therapy reduces breast cancer mortality but since her prognosis is very good, the absolute survival benefit is signfiicant but small. The alternative treatment option is to monitor for recurrence or new development by imaging and exams.            Kelsey Rubio had many appropriate questions which were all answered to her satisfaction.  After our detailed discussion, we agreed to the following:     Stage IB, HR+ HER2- Left Breast cancer  - Start letrozole 2.46m daily   - Monitor side effects as described above  -- monitor lipid panel annually; ordered today  - proceed with adjuvant breast radiation  - Next diag BL MG due in 09/2021, will order on next visit  - Healthy lifestyle advised: exercise regularly (at least 30 mins a day), maintain healthy weight (BMI 20-25), limit alcohol intake to less than 1 drink per week, and avoid any smoking  - Contact [place] for any breast symptoms or breast cancer related concerns.    At risk for bone density loss: due to aromatase inhibitor  - Obtain baseline DEXA bone density scan and fasting lipid panel given the need to start adjuvant aromatase inhibitor   - take a total of Calcium 10021m(diet+supplement) and Vitamin D 800 I.U. per day, along with daily weight bearing exercises for optimal bone health  - annual 25OH vitD lab to ensure adequate level; ordered today    No follow-ups on file.     I spent 15 minutes today reviewing chart notes, imaging, pathology reports, updating relevant history; 40 minutes face to face with the patient including physical exam and plan; as well as 10 minutes  placing orders, coordination of care, and note completion. Total time spent: 65 minutes.    Kelsey OchoaMD PhD  [facility]

## 2021-01-29 NOTE — Telephone Encounter (Signed)
Call received from pt.      Pt is requesting to be referred to another provider/Oncology Care facility that is located closer to pt.      Per pt, requesting to be referred to [facility] in [place].  Pt is aware of Referral/auth being needed to establish care.  Pt is requesting for any necessary records for pt to establish care to be sent to [place] for review- but no contact info provided curing call.    Pt also requests follow-up call once Referral is sent to [facility].    Routing encounter to Dr. Benjie Karvonen and [name] RN to assist with New Referral

## 2021-02-01 NOTE — Telephone Encounter (Addendum)
pts med onc referral faxed to Dr Leslye Peer at the [facility]. Pt notified of referral and contact number to his office.    Pt states she has not has seen rad onc yet as [place] wanted her to see med onc first. Will f/u with [facility]

## 2021-02-03 ENCOUNTER — Encounter (HOSPITAL_BASED_OUTPATIENT_CLINIC_OR_DEPARTMENT_OTHER): Payer: Self-pay

## 2021-02-03 NOTE — Telephone Encounter (Signed)
Spoke with Thomos Lemons at the [facility] and she states that Dr [place] Gourd had reviewed pts notes and wanted her seen by med onc before she was scheduled for an appt.    Pt was initially scheduled with Dr Aurea Graff on 12/16/20 which was cancelled by and then r/s to 01/29/21 and again pt cancelled by pt as she now wants to be referred to med onc closer to her.     Informed Ileana pt is now referred to Dr Renaldo Harrison.  She will let Dr [place] Gourd know and see if pt can be scheduled for a rad onc appt now.     Call to [place] to see if referral was received. No answer. Will try again later.

## 2021-02-03 NOTE — Telephone Encounter (Signed)
Spoke with Lattie Haw at [employer]. pts med onc referral was received. Pt will be called to get scheduled for an appointment with Dr Woodfin Ganja

## 2021-02-05 NOTE — Telephone Encounter (Signed)
Spoke with Ileana at [facility] yesterday and she states that the rad onc referral does not have pts Josem Kaufmann, she asked Josem Kaufmann be for [facility] [employer] [place] [phone].    Message sent to Mimi in [place] and received message back from [place] that [place] was re-submitted.     Ileana notified that request for auth was re-submitted.

## 2021-02-09 NOTE — Telephone Encounter (Signed)
Received VM message from [facility] with [facility] [employer] asking if Kelsey Rubio was corrected for [employer] [facility]. Patient is scheduled tomorrow. Will have to cancel if Kelsey Rubio is not processed soon.     Spoke with [employer]. States [name] requested an updated auth with new location and they are waiting to hear if this has been authorized. Patient is scheduled at 3:15pm tomorrow. States they want to give appt to someone else if they are going to need to cancel. Let Thomos Lemons know that message will be sent to auth team now and will call with update prior to close of business today.

## 2021-02-09 NOTE — Telephone Encounter (Signed)
Received VM message from Gar[place] at [facility] requesting an update on Rad Onc referral. Please call back at [phone].     Call placed to [facility]. Office is closed for lunch.

## 2021-02-09 NOTE — Telephone Encounter (Signed)
Ileana notified that NCM is still waiting on response and that it will likely be tomorrow before we hear back. She will try to hold appt until in the morning and will cancel if she doesn't hear back from [place].

## 2021-02-10 NOTE — Telephone Encounter (Signed)
Per Baxter Flattery pt rad onc auth denied at [facility] and referred to Dr Rigoberto Noel in [facility]. Informed Baxter Flattery that is too far for pt to get to as she lives in [place]. She will check with insurance if pt can get auth'd for another location close to [place].

## 2021-02-10 NOTE — Telephone Encounter (Signed)
pts appt with [facility] cancelled per Ileana since no auth received yet.     Message from Omar Person, that she called pts Insurance as Josem Kaufmann was only for one visit on 02/09/21 and needs extension.    Will cont to f/u with Baxter Flattery on auth

## 2021-02-17 NOTE — Telephone Encounter (Signed)
email received from Omar Person that pt will need to update her insurance with her new address, so that a rad onc location near her can be assigned.     Call to pt. Left detailed message on the above, and for pt to return call

## 2021-02-24 ENCOUNTER — Encounter (HOSPITAL_BASED_OUTPATIENT_CLINIC_OR_DEPARTMENT_OTHER): Payer: Self-pay | Admitting: Surgical Oncology

## 2021-02-24 DIAGNOSIS — C50919 Malignant neoplasm of unspecified site of unspecified female breast: Secondary | ICD-10-CM

## 2021-02-24 NOTE — Telephone Encounter (Addendum)
VM received from pt that her appt with medical oncologist set for yesterday got cancelled due to no approval from her insurance. She says she will see med onc at [facility].    Spoke with pt. Her appt was at the [facility] in [place] and it got cancelled due to no insurance approval. She had called her insurance to update her address and was told that she needs to be seen at the county office to get it updated. Pt states she went through that process when she moved from [place] to [facility] and now needs it transferred to [employer]. She says that it will take a while for the transfer of her insurance as she will need to meet with a case worker. She will leave her insurance as is for now so that there will be no further delays in her care.     She is now willing to be seen at [facility] for her care. She asked for assistance with scheduling with med onc. Will send message to med onc team.  Will have Dr Benjie Karvonen place stat referral for rad onc here.    Her insurance mentioned to her that they have transportation available for appts so she will follow up on that, as its hard for her to always get a ride to her appts here in [place]

## 2021-02-25 NOTE — Telephone Encounter (Signed)
Placed call to pt, goes straight to vm, left messages for call back.

## 2021-02-26 ENCOUNTER — Telehealth (HOSPITAL_BASED_OUTPATIENT_CLINIC_OR_DEPARTMENT_OTHER): Payer: Self-pay | Admitting: Radiation Oncology

## 2021-02-26 NOTE — Telephone Encounter (Signed)
Pt is returning call to schedule npt appt. Please give pt a call back to schedule. Thank you.

## 2021-02-26 NOTE — Telephone Encounter (Signed)
Confirmed new patient consultation with Dr. Aurea Graff for 2/24 @ 7am.    Added pt to wait list.    Has the patient been notified of:  Time of appt- yes  Date of appt- yes  Provider- yes  Location- yes  Visitor policy- yes    Requested interp- Not needed

## 2021-03-05 ENCOUNTER — Other Ambulatory Visit: Payer: Self-pay

## 2021-03-05 ENCOUNTER — Encounter (HOSPITAL_BASED_OUTPATIENT_CLINIC_OR_DEPARTMENT_OTHER): Payer: Self-pay | Admitting: Hematology & Oncology

## 2021-03-05 ENCOUNTER — Ambulatory Visit: Payer: Medicaid Other | Attending: Surgical Oncology | Admitting: Hematology & Oncology

## 2021-03-05 VITALS — BP 137/76 | HR 75 | Temp 98.2°F | Resp 14

## 2021-03-05 DIAGNOSIS — Z170 Estrogen receptor positive status [ER+]: Secondary | ICD-10-CM | POA: Insufficient documentation

## 2021-03-05 DIAGNOSIS — C50412 Malignant neoplasm of upper-outer quadrant of left female breast: Secondary | ICD-10-CM | POA: Insufficient documentation

## 2021-03-05 DIAGNOSIS — C50912 Malignant neoplasm of unspecified site of left female breast: Secondary | ICD-10-CM | POA: Insufficient documentation

## 2021-03-05 DIAGNOSIS — Z9189 Other specified personal risk factors, not elsewhere classified: Secondary | ICD-10-CM

## 2021-03-05 DIAGNOSIS — E663 Overweight: Secondary | ICD-10-CM | POA: Insufficient documentation

## 2021-03-05 MED ORDER — LETROZOLE 2.5 MG OR TABS
2.5000 mg | ORAL_TABLET | Freq: Every day | ORAL | 3 refills | Status: AC
Start: 2021-03-05 — End: ?

## 2021-03-05 MED ORDER — LIDOCAINE-PRILOCAINE 2.5-2.5 % EX CREA
1.0000 | TOPICAL_CREAM | CUTANEOUS | 1 refills | Status: AC | PRN
Start: 2021-03-05 — End: ?

## 2021-03-05 NOTE — Patient Instructions (Addendum)
1) Your Oncotype DX result was 8 ( you will not benefit from chemotherapy)     The Oncotype DX test is a genomic test that analyzes the activity of a group of genes that can affect how a cancer is likely to behave and respond to treatment. The Oncotype DX is used in two ways:  to help doctors figure out a woman's risk of early-stage, estrogen-receptor-positive breast cancer coming back (recurrence), as well as how likely she is to benefit from chemotherapy after breast cancer surgery               2) Dr. Aurea Graff discussed starting anti-estrogen therapy with Letrozole pill daily and Zoladex injections monthly to decrease your risk of breast cancer recurrence.  You will receive a call from our [facility] location to schedule your injections. Please call them if you do not hear from them. You will also get labs drawn at the same time as your injection.  [address], [address], [place], [place] [address]  Hours: Weekdays, 8:15 a.m. - 4:30 p.m.  [phone]    Start Letrozole pill at the same time you start your Zoladex injection       3) Call Radiology at [phone] to schedule   -- baseline Dexa scan at any time because the Letrozole can cause bone loss.   -- left breast mammogram    4) EMLA cream sent to your pharmacy to apply to your abdomen prior to Zoladex injection     5) Please stop by the front desk to schedule a 3 month follow-up with Dr. Aurea Graff       We recommend anti-estrogen therapy for at least 5 years to lower recurrence risk.    Letrozole is recommended for women who are post-menopausal.     - Potential side effects of Letrozole include: joint stiffness, hot flashes, insomnia, hair thinning, mood changes, insomnia, and changes in libido. There can also be changes to bone density and cholesterol levels.   - Exercise is very helpful for joint stiffness and hot flashes. You can start with 10 minutes of walking in the morning and evening.  - If hot flashes are severe, we can prescribe medication to  help.     - We will check your bone density with a 'DEXA scan' every 1-2 years. If you have osteopenia or osteoporosis, we may offer additional medication to support bone strength.   - To help with bone strength and joint pain, we recommend adequate daily Calcium (1200-1500mg  daily), Vitamin D (400-800IU daily), and weight-bearing exercises.          Physician Heloise Ochoa, MD, PhD    COVID-19 Coronavirus Detection Assay at [facility] testing: [phone].    Administrative Assistant Noland Fordyce  Phone# [phone] Fax# [phone]  **Please call prior to coming in to pick up forms or letters to insure their availability; as they take 24-48 hours to prepare.     Nurse Case Manager Arthor Captain, OCN   Phone# [phone]  **For medication refills please have your pharmacy fax a request to the number above    Social Worker  Fransico Michael, LCSW   Phone: [phone]  **For emotional, social, psychosocial, spiritual & practical needs that may arise throughout cancer treatment and recovery

## 2021-03-05 NOTE — Interdisciplinary (Signed)
New patient with Dr. Aurea Graff here to discuss adjuvant treatment after left breast lumpectomy.    Accompanied by Clyda Hurdle     Review of records and physical examination done by Dr. Aurea Graff    Medications and Allergies: reviewed in Epic    Oncotype result: 8    A: Left breast, lumpectomy 11/10/20  -Invasive lobular carcinoma, histologic grade 2 (classic and pleomorphic),   pT3N0(sn), see synoptic report.   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   -Margins are negative for invasive and pleomorphic LCIS.   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   -Inked margins are negative for pleomorphic LCIS.   C: Left axillary sentinel lymph node, resection   -Two lymph nodes, negative for carcinoma (0/2).   -One lymph node with benign squamous lined cyst.     Breast biomarker testing   Testing was performed and results were reported on a prior biopsy   ([id number]); results are outlined again below:   Estrogen receptor (ER): Positive:   Percentage of cells with nuclear positivity: 90%   Average intensity of staining: Strong   Progesterone receptor (PR): Positive:   Percentage of cells with nuclear positivity: 90%   Average intensity of staining: Strong   HER2 (IHC): Negative (score 0, ASCO/CAP 2018)   HER2 FISH: Negative     BREAST CANCER RISK FACTORS:   Menarche 13  Pre-menopausal    LMP:  02/27/21  Pregnancies G3 P2   --Age at first birth: 88  Prior use of hormonal Birth Control: OCP x 10 yrs  Prior use of HRT: no  Ashkenazi [employer] Heritage: no  Family history of breast cancer: no  Family history of other cancer: no  Prior genetic testing: no    Nursing assessment:   Patient repors she was going to [facility] weight loss clinic before she was diagnosed (was on phentermine and B12 shots).  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   Barriers to learning:none  Fall risk assessment reviewed.    Wellbeing Screening    Screening  [facility]     Question 03/05/2021  5:45 AM PST - Filed by Patient    Would you like to complete the Wellbeing Screening at this time? / Desea completar la evaluacin de bienestar en este momento? Yes    Who is filling out this form? / Quin esta llenando este formulario? I am completing the form for myself. / Estoy completando el formulario por mi cuenta.    1. In general, would you say your health is: / En general, dira que su salud es: Very Good/Muy bien    2. In general, would you say your quality of life is: / En general, dira que su calidad de vida es: Very Good/Muy bien    3. In general, how would you rate your physical health? / En general, cmo calificara su  salud fsica? Good/Bien    4. In general, how would you rate your mental health, including your mood and your ability to think? / En general, cmo calificara su salud mental, incluyendo su estado de nimo y su capacidad para pensar? Very Good/Muy bien    5. In general, how would you rate your satisfaction with your social activities and relationships? / En general, cmo calificara su satisfaccin con sus actividades sociales y sus relaciones con [place]? Very Good/Muy bien    6. To what extent are you able to carry out your everyday physical activities such as walking, climbing stairs, carrying groceries, or moving a chair? / En qu medida puede realizar sus  actividades fsicas diarias, como caminar, subir escaleras, cargar las compras o mover una silla? Moderately/Moderadamente    7. In general, please rate how well you carry out your usual social activities and roles. (This includes activities at home, at work and in [employer], and responsibilities as a parent, child, spouse, employee, friend, etc.) / En general, califique en qu medida puede realizar sus actividades sociales y funciones habituales. ([place], en el trabajo y en el rea donde reside, as como sus responsabilidades como padre o madre, hijo/a, cnyuge, empleado/a, amigo/a, etc.)  Good/Bien    In the past 7 days / En los ultimos 7 dias     8. How would you rate your pain on average? / En promedio, cmo calificara su dolor? 0    9. How would you rate your fatigue on average? / En promedio, cmo calificara su cansancio? Moderate/Moderado    10. How often have you been bothered by emotional problems such as feeling anxious, depressed or irritable? / Con qu frecuencia le han afectado problemas emocionales como sentir ansiedad, depresin o irritabilidad?  Sometimes/Algunas veces    11. Do you have concerns about your fertility? / Tiene preocupaciones acerca de su fertilidad? No    12. Do you have concerns in any of the following areas: / Tiene preocupaciones en algunas de las siguientes reas? Financial / Econmicas    13. Would you like to speak with anyone about your spiritual needs? / Deseara hablar con alguien acerca de sus necesidades espirituales? No    PROMIS Adult Short Form-Global Health Score (Physical) (range: 16 - 68) 44.9    PROMIS Adult Short Form-Global Health Score (Mental) (range: 21 - 68) 50.8         Plan of Care:   Start OA/AI  Patient lives in [place], [place]  Injections in [place]   [place] consult scheduled  F/u 3 month with Dr. Aurea Graff and mammogram same day  Baseline Dexa scan   Labs will be in [facility] plan with first Zoladex  injection         Information regarding support groups and social services provided.  Contact information given.    AVS reviewed with patient. Patient verbalized understanding of written and oral instructions.

## 2021-03-05 NOTE — Progress Notes (Signed)
MEDICAL ONCOLOGY INITIAL CONSULTATION NOTE    VISIT DATE: 03/05/2021    REASON FOR REFERRAL: left breast cancer; discuss adjuvant therapy    REFERRING PHYSICIAN: Remer Rubio   [facility] communication will be sent to referring provider and PCP    DEMOGRAPHICS:     Medical Record #: [id number]    DOB: 01/08/1976    Age: 46 year old    TREATMENT SUMMARY    Date of diagnosis:     Diagnosis: Stage IB, pT3N0(sn), invasive lobular carcinoma of the Left breast; grade 2, ER 3+ 90%, PR 3+ 90%, HER2 IHC 0, FISH neg; OncotypeDX RS=8    Local therapy: Left lumpectomy and sentinel node biopsy (11/10/2020)     Systemic therapy: TBD    Surgical Oncologist: Kelsey Rubio    Radiation Oncologist: Kelsey Rubio    Current Primary Care Physician:  Kelsey Rubio    HISTORY OF PRESENT ILLNESS:  Kelsey Rubio is a 46 year old woman with stage IB, pT3N0(sn), HR+ HER2- Left breast cancer with low oncotypeDX recurrence score of 8, s/p left lumpectomy and sentinel node biopsy (11/10/2020), s/p adjvuant breast radiation, who is referred to establish care with medical oncology.  Of note, she had insurance issues and medonc access near her home and thus tx delay.     Today, pt is here with her boyfriend Kelsey Rubio.   Reports still having some numbness in the left upper arm/axilla area  She noted some hardness near lumpectomy incision area.   No new lumps, skin or nipple changes.   Has not started on radiation. She is scheduled to see Kelsey Rubio on March 1 for consultation.    Reports that she has severe mood swings around menstrual period.  Thinks that this has been milder since surgery.    Also reports being in a wt management program in [place] before but didn't like amphetamines (had GI upset, cramping) and thus stopped it didn't go back to the program.  Still interested in wt management.  Worries about wt gain with estrogen blocking therapy.   On rest of review of systems, no fevers, chills, headaches, chest pain/pressure, dyspnea, nausea, abdominal  pain, changes in urinary or bowel habits, or new bone pain.       BREAST CANCER HISTORY:    2020:  Screening MG: benign  06/25/2020: Diag MG, [place] ([facility], [place]):     Asym in lower R breast; neg US findings    Left breast UO, an area of architectural distortion    Left breast 2:00 N+9, irreg mass 2.61 x 1.51 x 1.57cm    Nl left axillary node  07/23/2020: Left breast 2:00 bx: ILC, grade 2, 10m, no LVI    ER 3+ 91-100%, PR 3+ 90%, HER2 IHC 1+ negative    Repeated at [place]: ER 3+ 90%, PR 3+ 90%, HER2 IHC 0, FISH neg     Ki67 11%    +LCIS, classic   09/30/2020: MRI bl breast: known ILC Left breast 2:00, 6.2 x 3.7 x 2.3cm    No LAD  10/07/2020: Diag BL MG and L [place] irreg mass 2.0 x 1.3 x 2.0 cm, L breast at 2:00,     N+9  11/10/2020: Left breast lumpectomy and SLB: pT3N0(sn), ILC g2, size 52m   Classic and pleomorphic; extensive LCIS, classic and pelomorphic    Margins negative    0/2 left SLN  12/01/2020: OncotypeDX RS=8    BREAST CANCER RISK FACTORS:   Menarche 12  Pre-menopausal  LMP:  Patient's last menstrual period was 02/27/2021 (within days).   Menopause @  ? Natural menopause  Pregnancies G3 P2   --Age at first birth: 63  Prior use of hormonal Birth Control: OCP x 10 yrs  Prior use of HRT: no  Ashkenazi Jewish Heritage: no  Family history of breast cancer: no  Family history of other cancer: no  Prior genetic testing: no    Current Outpatient Medications   Medication Sig    acetaminophen (TYLENOL) 325 MG tablet Take 2 tablets (650 mg) by mouth every 4 hours as needed for Mild Pain (Pain Score 1-3).    albuterol 108 (90 Base) MCG/ACT inhaler INHALE 2 PUFFS BY MOUTH EVERY 4 TO 6 HOURS AS NEEDED    Beclomethasone Diprop HFA (QVAR REDIHALER IN)     gabapentin (NEURONTIN) 300 MG capsule Take 1 capsule (300 mg) by mouth 3 times daily.    hydrochlorothiazide (HYDRODIURIL) 25 MG tablet Take 1 tablet (25 mg) by mouth daily.    letrozole (FEMARA) 2.5 MG tablet Take 1 tablet (2.5 mg) by mouth daily.     lidocaine-prilocaine (EMLA) 2.5-2.5 % cream Apply 1 Application. topically as needed (a pea size amount on the port site 30 minutes before port access). Apply to affected area    montelukast (SINGULAIR) 10 MG tablet every evening.     No current facility-administered medications for this visit.       ALLERGIES: is allergic to aspirin and ibuprofen.    Past Medical History:   Diagnosis Date    Asthma     Hypertension     Palpitations    No history of DVT, PE, thrombotic stroke, TIA, or known inherited clotting disorders.  No history of cardiovascular disease.  No history of bone fractures or osteoporosis.  Latest DEXA scan: none    No past surgical history on file.    Social History     Socioeconomic History    Marital status: Single   Tobacco Use    Smoking status: Never    Smokeless tobacco: Never   Substance and Sexual Activity    Alcohol use: Yes     Alcohol/week: 2.0 standard drinks     Types: 2 Glasses of wine per week     Comment: once a week    Drug use: Never     No family history on file.    PHYSICAL EXAM:  BP 137/76 (BP Location: Right arm, BP Patient Position: Sitting, BP cuff size: Regular)    Pulse 75    Temp 98.2 F (36.8 C) (Temporal)    Resp 14    LMP 02/27/2021 (Within Days)    SpO2 96%   ECOG Performance Status: 0-1  GEN: well appearing, alert, pleasant, in no distress  HEENT: mmm, scleral anicteric   CV:  RRR, nl S1 S2, no murmurs  PULM: CTAB  ABD: Soft, non-distended, non-tender  EXT: No edema  SKIN: No jaundice, rash  LN: No cervical, supraclavicular, axillary lymphadenopathy.  RIGHT BREAST: No masses, skin or nipple abn, or nipple discharge.    LEFT BREAST: well healed Lumpectomy and axillary incisions; No masses, skin or nipple abn, or nipple discharge.      LABS:    10.9* \ 14.4 / 327 %S: 47  %B: --    / 40.8 \  %L: 34  %M: 12   09/08 1542 %E: 5      Na 140 (09/08) CL 99 (09/08) BUN 14 (09/08) GLU   91 (  09/08)   K 3.5 (09/08) CO2 28 (09/08) Cr 0.88 (09/08)      TP   ALT   TBILI    ALK PHOS      ALB   AST   DBILI        IMAGING:    MRI bl breast, 09/30/2020  FINDINGS:  The technical quality of this study is considered adequate to make a final assessment and recommendation.    There is heterogeneous fibroglandular tissue bilaterally and moderate background enhancement.    Coronal T2 sequences demonstrates normal bilateral axillary nodes.    Axial T2 sequence demonstrates no large cysts or fluid collections.    Non fat saturated T1 sequence demonstrates no fat containing lesions. Signal void in the left breast at site of biopsy marking clip seen on mammogram.    High resolution post contrast sequence demonstrates no internal mammary lymphadenopathy.    Regarding the right breast: There are no masses, architectural distortion or suspicious areas of enhancement.    Regarding the left breast: Known invasive lobular carcinoma in the left breast is seen as an irregular mass with spiculated and associated focal non-mass enhancement at the 2 o'clock position, 9 cm from the nipple measuring 6.2 x 3.7 x 2.3 cm (post contrast dynamic image 102). Internal enhancement is heterogeneous. Kinetic assessment demonstrates fast initial enhancement followed by washout in the delayed portions of the curve.    IMPRESSION:  1.Known invasive lobular carcinoma in the left breast at 2:00 measures 6.2 x 3.7 x 2.3 cm.    2. No lymphadenopathy.    Recommendations: Diagnostic mammogram and left breast ultrasound are scheduled for 10/07/2020.    BI-RADS category 1, negative right breast.    BI-RADS category 6, known biopsy proven malignancy left breast.    Diag MG and L [place], 10/07/2020  DIAGNOSTIC MAMMOGRAM WITH DIGITAL BREAST TOMOSYNTHESIS FINDINGS:  The breasts are heterogeneously dense, which may obscure small masses.    In the right breast there are no masses, asymmetries, or suspicious calcifications.    There is an irregular mass with spiculated margins and associated marker clip in the left breast at 2  o'clock located 9 centimeters from the nipple.    ULTRASOUND FINDINGS:  Sonography was performed in the left breast in transverse and longitudinal planes. Ultrasound demonstrates an irregular mass measuring 2.0 x 1.3 x 2.0 cm in the left breast at 2 o'clock located 9 centimeters from the nipple.  The mass is poorly delineated on mammography and ultrasound, and is known to be substantially larger than these ultrasound measurements based on 09/30/2020 MRI.    No axillary lymphadenopathy identified.    IMPRESSION / RECOMMENDATION:  There is no evidence of malignancy in the right breast.    Mass in the left breast is a known malignancy.    Clinical management of known malignancy is recommended.    ASSESSMENT:  BI-RADS Category 6:  Known Biopsy Proven Malignancy    Left breast US, 11/19/2020    ULTRASOUND FINDINGS:  Sonography was performed in the left breast in transverse and longitudinal planes. There is an oval post- surgical fluid collection with circumscribed margins measuring 2.8 x 1.0 x 2.3 cm in the axillary tail of the left breast.    No axillary lymphadenopathy identified.    IMPRESSION / RECOMMENDATION:  Post- surgical fluid collection in the left breast is benign.    Ultrasound follow up in one week will be scheduled.    ASSESSMENT:  BI-RADS Category 2:  Benign  Finding(s)    PATHOLOGY:    FINAL PATHOLOGIC DIAGNOSIS:   Outside slides received [facility],   [place], [place]; "[id number]"; 07/23/2020):   A: Left breast, 2:00 o'clock, core biopsy (A):   -Invasive lobular carcinoma, provisional histologic (mBR) grade 2, see   comment.   -Lobular carcinoma in situ, classic type.   Breast Needle Biopsy Tumor Synoptic Report:   Invasive tumor type: Invasive lobular carcinoma   Invasive tumor size: 13 mm (largest dimension in a core, multiple cores   involved)   Provisional histologic grade (Nottingham modification,   Scarf-Bloom-Kelsey): Grade 2   Nuclear Pleomorphism: Score 3    Mitotic rate: Score 1   Glandular/Tubular Differentiation: Score 3   Total score: 7   Ductal Carcinoma In Situ: Not identified   Ancillary Studies: A review of the outside immunohistochemical slides, with   their appropriately reactive controls, showed the tumor to display the   following immunoprofile:   Estrogen receptor (ER): Positive:   Percentage of cells with nuclear positivity: 91-100%   Average intensity of staining: Strong   Progesterone receptor (PR): Positive:   Percentage of cells with nuclear positivity: 90%   Average intensity of staining: Strong   Her2/neu (IHC):   Negative (score 1+, ASCO/CAP 2018)   ADDENDUM:   IMMUNOHISTOCHEMISTRY RESULTS   [Performed on Unstained Slides Cut and Prepared at [place] on Outside Block   A1]:   Estrogen receptor (ER): Positive:   Percentage of cells with nuclear positivity: 90%   Average intensity of staining: Strong   Progesterone receptor (PR): Positive:   Percentage of cells with nuclear positivity: 90%   Average intensity of staining: Strong   HER2 (IHC): Negative (score 0, ASCO/CAP 2018)   HER2 (FISH): Pending   ADDENDUM:   HER2 FISH RESULTS:   [Performed on Unstained Slides Cut and Prepared at [place] from Outside Block   A1]   FINAL RESULT: HER2 NEGATIVE   HER2:CEP17 signal ratio = 1.1   Average number of HER2 signals per nucleus = 1.8   Average number of CEP17 signals per nucleus = 1.6     Left breast lumpectomy and sentinel node biopsy, 11/10/2020  FINAL PATHOLOGIC DIAGNOSIS:   A: Left breast, lumpectomy   -Invasive lobular carcinoma, histologic grade 2 (classic and pleomorphic),   pT3N0(sn), see synoptic report.   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   -Margins are negative for invasive and pleomorphic LCIS.   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   -Inked margins are negative for pleomorphic LCIS.   C: Left axillary sentinel lymph node, resection    -Two lymph nodes, negative for carcinoma (0/2).   -One lymph node with benign squamous lined cyst.   BREAST CANCER: SYNOPTIC REPORT:   (CAP Version 4.5.0.0, posted June 2021, based on AJCC 8th Edition)   Specimen/Procedure:   Excision   Lymph Node Sampling:   Sentinel lymph nodes   Specimen Laterality:   Left   Tumor Focality:   Unifocal   Invasive Tumor Type:   Invasive lobular carcinoma (mixed of classic and pleomorphic)   Invasive Tumor Size:   53 mm (maximum diameter)   Histologic Grade (Nottingham modification, Scarf-Bloom-Kelsey System):   Grade 2     Nuclear Pleomorphism: Score 3     Mitotic rate: Score 1     Glandular/Tubular Differentiation: Score 3     Total score: 7   Lymphovascular Invasion:  Absent   Resection Margins For Invasive Tumor:   Margins uninvolved by invasive carcinoma   Distance from closest margin: Widely clear -greater than 10 mm; including   specimens B, which is negative for invasive carcinoma   Duct Carcinoma In Situ (DCIS):   Not identified   Lobular Carcinoma In Situ (LCIS):   Present   LCIS Type:   Classic and pleomorphic (nuclear grades 2 and 3), mixed   LCIS Extent:   Extensive. Pleomorphic LCIS is not seen at the inked margins   Lymph Nodes Positive /Total Lymph Nodes Sampled: 0/2   Number of lymph nodes with macrometastases: 0   Number of lymph nodes with micrometastases: 0   Number of Lymph Nodes with Isolated Tumor Cells: 0   Pancytokeratin is negative.   Treatment Effect(s): Response to Presurgical (Neoadjuvant) Therapy in the   Breast:   Not applicable (no known presurgical therapy)   Therapy-related changes in lymph node:   Not applicable   Pathologic Stage Classification:   Primary Tumor (Invasive Carcinoma) (pT):   pT3: Tumor >50 mm in greatest dimension   Regional Lymph Nodes (pN):   (sn):   Sentinel node(s) evaluated.   pN0: No regional lymph node metastasis identified or ITCs only   Nipple:   Not applicable   Skin/dermis:   Not applicable   Skeletal  Muscle:   Not applicable   Breast biomarker testing   Testing was performed and results were reported on a prior biopsy   ([id number]); results are outlined again below:   Estrogen receptor (ER): Positive:   Percentage of cells with nuclear positivity: 90%   Average intensity of staining: Strong   Progesterone receptor (PR): Positive:   Percentage of cells with nuclear positivity: 90%   Average intensity of staining: Strong   HER2 (IHC): Negative (score 0, ASCO/CAP 2018)   HER2 FISH: Negative     OncotypeDX, 12/01/2020    RSClin        ASSESSMENT & PLAN:     Keir Foland is a 46 year old woman with stage IB, pT3N0(sn), HR+ HER2- Left breast cancer with low oncotypeDX recurrence score of 8, s/p left lumpectomy and sentinel node biopsy (11/10/2020), s/p adjvuant breast radiation, who is referred to establish care with medical oncology.  Of note, she had insurance issues and medonc access near her home and thus tx delay.         History, presentation, pathology reports, imaging, and labs were reviewed.  Pt recovered well from lumpectomy and SLNB. She has a good performance status (ECOG 0) with minimal medical co-morbidities.          We had a detailed discussion regarding the stage, histology, natural history, prognosis, and recurrence risk of her early breast cancer.  We discussed that there are two main treatment modalities for early stage, node negative, ER/PR positive HER2 negative breast cancer with a low OncotypeDX recurrence score: (1) local treatment with surgery and radiation to reduce risk of locoregional recurrence; and (2) systemic treatment with endocrine (anti-estrogen) therapy to reduce risk of locoregional and systemic recurrence. Due to low recurrence score, she will not benefit from adjuvant chemotherapy.  She has not met with radiation oncology to discuss adjuvant radiation therapy yet.            We then moved on to discussion of adjuvant endocrine treatment to prevent cancer recurrence in the  ipsilateral breast or new development of invasive cancer in the contralateral breast.  We discussed that with  endocrine therapy, she will have an absolute recurrence risk reduction of  11% and an overall survival improvement of about 2-3% over the next 10 yrs using the RSClin and NHS Predict Tool, respectively.  We also discussed that there are two main choices of endocrine therapy for pre-menopausal patients.  One choice is tamoxifen and the other is combination of ovarian function suppression and aromatase inhibitors (AIs) such as anastrozole, letrozole, or exemestane.  In pre-menopausal women, combination therapy has been shown to be more superior compared to tamoxifen alone based on SOFT/TEXT studies.            We then discussed the serious side effects associated with aromatase inhibitors include loss of bone density, fractures, hyperlipidemia, and increase cardiovascular events.  Other side effects of aromatase inhibitors include and not limited to joint pain and stiffness, muscle aches, carpal tunnel syndrome, hyperlipidemia, hair thinning, hot flashes and sexual dysfunction.  Informational handout on letrozole was given to pt.           We briefly discussed serious but rare side effects of tamoxifen include thromboembolic events (DVT, PE, stroke) and endometrial cancer were discussed with patient.  Other side effects associated with the use of tamoxifen include and not limited to hot flashes, vaginal dryness or discharge, menstrual irregularities, sexual dysfunction, ovarian cysts, mood changes, depression, fatty liver disease, and cataracts.           Ms Styles had questions which were all answered to her satisfaction.  After our detailed discussion, we agreed to the following:     Stage IB, HR+ HER2- Left Breast cancer:  - goserelin injections monthly; ICSR and tx plan placed to start now  - Start letrozole 2.60m daily at the same time of starting goserelin injections  - Monitor side effects as  described above; annual lipid panel while on aromatase inhibitor  - Rx EMLA cream for number pre-Zoladex injections  - Next diag Left MG, 6 months f/u post-op, scheduled for 5/26  - Healthy lifestyle advised: exercise regularly (at least 30 mins a day), maintain healthy weight (BMI 20-25), limit alcohol intake to less than 1 drink per week, and avoid any smoking  - Contact [facility] for any breast symptoms or breast cancer tx related concerns.    At risk for bone density loss: due to aromatase inhibitor  - Obtain baseline DEXA bone density scan and fasting lipid panel given the need to start adjuvant aromatase inhibitor in the near future  - take a total of Calcium 10060m(diet+supplement) and Vitamin D 800 I.U. per day, along with daily weight bearing exercises for optimal bone health  - annual 25OH vitD lab to ensure adequate level    Overweight: pt desires wt mgmt for breast cancer survivorship.  Referral placed to wt mgmt clinic.     Return in about 13 weeks (around 06/04/2021) for Dr. YeAurea Graff     I spent 15 minutes today reviewing chart notes, imaging, pathology reports, updating relevant history; 40 minutes face to face with the patient including physical exam and plan; as well as 10 minutes placing orders, coordination of care, and note completion. Total time spent: 65 minutes.    KaHeloise OchoaMD PhD  [place]

## 2021-03-05 NOTE — Goals of Care (Signed)
Advance Care Planning    What gives the patient's life meaning?      Patient would be willing to endure aggressive medical therapies as long as they could still:       Who would make medical decisions for the patient if they are unable to make decisions for themselves?   Shari Prows daughter [phone]    Based on above information I recommended the following:  reviewing [email or web address]    Total time spent face-to-face with patient and/or surrogate decision maker providing counseling related to advance care planning:   1 minutes

## 2021-03-10 ENCOUNTER — Ambulatory Visit
Admission: RE | Admit: 2021-03-10 | Discharge: 2021-03-10 | Disposition: A | Discharge status: Home / Self Care | Payer: Medicaid Other | Attending: Radiation Oncology | Admitting: Radiation Oncology

## 2021-03-10 ENCOUNTER — Other Ambulatory Visit: Payer: Self-pay

## 2021-03-10 ENCOUNTER — Telehealth (INDEPENDENT_AMBULATORY_CARE_PROVIDER_SITE_OTHER): Payer: Self-pay

## 2021-03-10 ENCOUNTER — Ambulatory Visit (HOSPITAL_BASED_OUTPATIENT_CLINIC_OR_DEPARTMENT_OTHER): Admit: 2021-03-10 | Discharge: 2021-03-10 | Disposition: A | Discharge status: Home Health | Payer: Medicaid Other

## 2021-03-10 ENCOUNTER — Encounter (HOSPITAL_BASED_OUTPATIENT_CLINIC_OR_DEPARTMENT_OTHER): Payer: Self-pay | Admitting: Radiation Oncology

## 2021-03-10 DIAGNOSIS — C50919 Malignant neoplasm of unspecified site of unspecified female breast: Secondary | ICD-10-CM

## 2021-03-10 DIAGNOSIS — Z170 Estrogen receptor positive status [ER+]: Secondary | ICD-10-CM | POA: Insufficient documentation

## 2021-03-10 DIAGNOSIS — C50412 Malignant neoplasm of upper-outer quadrant of left female breast: Secondary | ICD-10-CM

## 2021-03-10 NOTE — Telephone Encounter (Addendum)
Tried reaching pt to schedule ZOLADEX for our [place] office. Left a message to pt to please call back when able.                  ----- Message from Candise Che, RN sent at 03/05/2021  8:56 AM PST -----  Regarding: [facility]- new Zoladex to start 03/12/21  Infusion location: [place]  Appointment type: Injection  Expected treatment start date: 03/12/2021  Appointment request: New patient, first appt  Appointment type: Injection  Please sign [facility] orders before proceeding. Are [facility] orders signed? Yes  Treatment name(s): goserelin 3.6mg  every 28 days  Is patient ambulatory? Yes  Is patient on a clinical trial? No  Is this an off-label indication? No

## 2021-03-11 DIAGNOSIS — Z170 Estrogen receptor positive status [ER+]: Secondary | ICD-10-CM

## 2021-03-11 DIAGNOSIS — C50412 Malignant neoplasm of upper-outer quadrant of left female breast: Secondary | ICD-10-CM

## 2021-03-11 NOTE — Consults (Addendum)
CONSULTING PHYSICIAN:  Alice Rieger    PATIENT: Kelsey Rubio    DATE OF BIRTH: 1975/01/31    MEDICAL RECORD NUMBER: [id number]    DATE OF SERVICE: 03/10/2021    REQUESTING PHYSICIAN:  Tenna Delaine, MD    LOCATION OF SERVICE:  [facility], [place], [place] [address]    Patient Verification & Telemedicine Consent & Financial Waiver:  1. Identity: I have verified this patient's identity to be accurate.  2. Consent: I verify consent has been secured in one of the following  methods: (a) obtained written/ online attestation consent (via  MyChartVideoVisit pathway), (b) the spoke-side provider has obtained  verbal or written consent from patient/surrogate (if this is a "provider  to provider" evaluation), or (c) in all other cases, I have personally  obtained verbal consent from the patient/ surrogate (noting all elements  below) to perform this voluntary telemedicine evaluation (including  obtaining history, performing examination and reviewing data provided by  the patient).   The patient/ surrogate has the right to refuse this  evaluation.  I have explained risks (including potential loss of  confidentiality), benefits, alternatives, and the potential need for  subsequent face to face care. Patient/ surrogate understands that there is  a risk of medical inaccuracies given that our recommendations will be made  based on reported data (and we must therefore assume this information is  accurate).  Knowing that there is a risk that this information is not  reported accurately, and that the telemedicine video, audio, or data feed  may be incomplete, the patient agrees to proceed with evaluation and holds  [place] harmless knowing these risks.  3. Healthcare Team: The patient/ surrogate has been notified that other  healthcare professionals (including students, residents and Immunologist) may be involved in this audio-video evaluation.   All laws  concerning confidentiality and patient access to medical  records and  copies of medical records apply to telemedicine.  4. Privacy: If this is a Radiographer, therapeutic Visit, the patient/ surrogate has  received the [place] Notice of Privacy Practices via E-Checkin process.  For  all other video visit techniques, I have verbally provided the patient/  surrogate with the [place] in [place]  ([email or web address]) or Spanish  ([email or web address]://[email or web address]://[email or web address].[email or web address]).  The patient/  surrogate acknowledges both being provided the NPP link, and has been  offered to have the NPP mailed to the patient/ surrogate by [place] mail.  The  patient/ surrogate has voiced understanding an acknowledgement of receipt  of this NPP web address.  If the patient/surrogate has elected to receive  the NPP via [place] mail, I verify that the NPP will be sent promptly to the  patient/surrogate via [place] mail.  5. Capacity: I have reviewed this above verification and consent paragraph  with the patient/ surrogate and the patient is capacitated or has a  surrogate. If the patient is not capacitated to understand the above, and  no surrogate is available, since this is not an emergency evaluation, the  visit will be rescheduled until such time that the patient can consent, or  the surrogate is available to consent. If this is an emergency evaluation  and the patient is not capacitated to understand the above, and no  surrogate is available, I am proceeding with this evaluation as this is  felt to be an emergency setting and no appropriate specialist is available  at the bedside to perform these evaluations.  6. Financial Waiver: If this  is a Radiographer, therapeutic Visit, the patient has  been made aware of the financial waiver via E-Checkin process.  For all  other video visit techniques, an E-Checkin process is not performed.  As  such, I have personally verbally informed the patient/ surrogate that this  evaluation will be a billable encounter similar to an in-person clinic  visit, and  the patient/ surrogate has agreed to pay the fee for services  rendered.  If we are billing insurance for the patient's telehealth visit,  her out-of-pocket cost will be determined based on her plan and will be  billed to her.  The patient/ surrogate has also been informed that if the  patient does not have insurance or does not wish to use insurance, [place] [employer] price for a primary care telehealth visit is $59.00  and specialist telehealth visit is $88.00.  I have further informed the  patient/ surrogate that in the event the patient has additional services  provided in conjunction with the specialty visit (Ex. Psychotherapy  services), those services will be billed at the current rate less a 45%  discount.  7. Intra-State Location: The patient/ surrogate attests to understanding  that if the patient accesses these services from a location outside of  [place], that the patient does so at the patient's own risk and  initiative and that the patient is ultimately responsible for compliance  with any laws or regulations associated with the patient's use.  8. Specific Use:The patient/ surrogate understands that [facility] makes no  representation that materials or servicesdelivered via telecommunication  services, or listed on telemedicine websites, are appropriate or available  for use in any other location.    PATIENT LOCATION:  [address]  [place] [address]    REASON: 46 year old woman with clinically detected stage IB, pT3N0(sn),  HR+ HER2- Left breast cancer ?" Invasive Lobular Carcinoma ([place]), with low  oncotypeDX recurrence score of 8, s/p left lumpectomy and sentinel node  biopsy (11/10/2020), who is referred to establish care with radiation  oncology ?" via telehealth consultation today.    DIAGNOSIS: Stage IB, pT3N0(sn), invasive lobular carcinoma of the Left  breast; grade 2, ER 3+ 90%, PR 3+ 90%, HER2 IHC 0, FISH neg; OncotypeDX  RS=8  Local therapy: Left lumpectomy and  sentinel node biopsy (11/10/2020)    HISTORY OF PRESENT ILLNESS:    Patient initially palpated a breast mass, which led to diagnostic  mammogram and ultrasound in July 2022 (no reports available for review  today) ?" and core needle biopsy in September 2022 demonstrated [place].  Imaging has been repeated at [facility] and biopsy/surgery results are described  in detail below.    Today, patient reports numbness in the left upper arm/axilla area, and  firmness near lumpectomy incision area. Denies new lumps, skin or nipple  changes. She has recovered well from surgery on 1Nov2022, with normal Left  shoulder ROM.    Pre-menopausal  Pregnancies G3 P2  Prior use of hormonal Birth Control: OCP x 10 yrs  Prior use of HRT: no    PAST MEDICAL HISTORY:  Malignant neoplasm of upper-outer quadrant of left breast in female, ER+  Asthma  Hypertension  Palpitations    Lupus, scleroderma or other connective tissue disorders: no    CARDIAC DEVICE: no    PAST SURGICAL HISTORY:    GYN HISTORY:  G3   P3  A  Menarche: 30  Age at first pregnancy:  17  LMP:  Contraceptives: None  Menopause: N/A    CHEMOTHERAPY: No previous chemotherapy    RADIATION:  No previous radiation    MEDICATIONS:  Acetaminophen (Tylenol) 329m tablet  Albuterol 108 (90 Base) mcg/act inhaler  Beclomethasone diprop hfa (Qvar Redihaler In)  Gabapentin (Neurontin) 300 mg tablet  Hydrochlorothiazide (Hydrodiuril) 25 mg tablet  Letrozole (Femara) 2.559mtablet  Lidocaine-prilocaine (Emla) 2.5-2.5% cream  Montelukast (Singulair) 1067mablet    ALLERGIES:  Aspirin ?" wheezing and stuffy nose  Ibuprofen ?" wheezing and stuffy nose    FAMILY HISTORY:  No family history on file.    SOCIAL HISTORY: The patient is a non-smoker;  consumes 2 standard drinks  of alcohol/week; 2 glasses of wine per week.    denies drug use.  [place]    REVIEW OF SYSTEMS:   ROS Constitutional - Denies lack of appetite, fatigue  and change in weight. ROS Eyes - Denies  visual difficulties. ROS Neck -  Denies neck masses, muscle weakness and neck pain. ROS Breasts - Denies  breast masses, nipple discharge, nipple inversion and pain. ROS  Cardiovascular - Denies chest pain. ROS Respiratory - Denies cough and  dyspnea. ROS Gastrointestinal - Denies abdominal pain. ROS Genitourinary  (F) - Denies dysuria, frequency and genital masses. ROS Musculoskeletal -  Denies bone pain, joint pain, muscle weakness and decreased range of  motion. ROS Neurologic - Denies disorientation, dizziness, abnormal gait  and headaches. ROS Psychiatric - Denies depression. ROS Endocrine - Denies  diabetes, hot flashes, menstrual irregularities and thyroid disease. ROS  Hematologic/Lymphatic - Denies easy bruising and tender or enlarged lymph  nodes.    CHAPERONE DOCUMENTATION:    FATIGUE SCORE: 0    PERFORMANCE:  0 - Fully active, able to carry on all predisease activities  without restrictions. (ECOG)    REVIEW OF RADIOLOGY: I performed a thorough review of the patient's most  recent radiographic reports during this visit. I also personally reviewed  and interpreted the recent imaging for this patient.    10/07/2020 Diagnostic mammogram with digital breast tomosynthesis ?"  bilateral  DIAGNOSTIC MAMMOGRAM WITH DIGITAL BREAST TOMOSYNTHESIS FINDINGS:  The breasts are heterogeneously dense, which may obscure small masses.    In the right breast there are no masses, asymmetries, or suspicious  calcifications.    There is an irregular mass with spiculated margins and associated marker  clip in the left breast at 2 o'clock located 9 centimeters from the  nipple.    ULTRASOUND FINDINGS:  Sonography was performed in the left breast in transverse and longitudinal  planes. Ultrasound demonstrates an irregular mass measuring 2.0 x 1.3 x  2.0 cm in the left breast at 2 o'clock located 9 centimeters from the  nipple.  The mass is poorly delineated on mammography and ultrasound, and  is known to be substantially larger  than these ultrasound measurements  based on 09/30/2020 MRI.    No axillary lymphadenopathy identified.    IMPRESSION / RECOMMENDATION:  There is no evidence of malignancy in the right breast.    Mass in the left breast is a known malignancy.    Clinical management of known malignancy is recommended.    ASSESSMENT:  BI-RADS Category 6:  Known Biopsy Proven Malignancy    09/30/2020 B/L Breast MRI:    FINDINGS:  The technical quality of this study is considered adequate to make a final  assessment and recommendation.    There is heterogeneous fibroglandular  tissue bilaterally and moderate  background enhancement.    Coronal T2 sequences demonstrates normal bilateral axillary nodes.    Axial T2 sequence demonstrates no large cysts or fluid collections.    Non fat saturated T1 sequence demonstrates no fat containing lesions.  Signal void in the left breast at site of biopsy marking clip seen on  mammogram.    High resolution post contrast sequence demonstrates no internal mammary  lymphadenopathy.    Regarding the right breast: There are no masses, architectural distortion  or suspicious areas of enhancement.    Regarding the left breast: Known invasive lobular carcinoma in the left  breast is seen as an irregular mass with spiculated and associated focal  non-mass enhancement at the 2 o'clock position, 9 cm from the nipple  measuring 6.2 x 3.7 x 2.3 cm (post contrast dynamic image 102). Internal  enhancement is heterogeneous. Kinetic assessment demonstrates fast initial  enhancement followed by washout in the delayed portions of the curve.    IMPRESSION:  1.Known invasive lobular carcinoma in the left breast at 2:00 measures 6.2  x 3.7 x 2.3 cm.    2. No lymphadenopathy.    Recommendations: Diagnostic mammogram and left breast ultrasound are  scheduled for 10/07/2020.    BI-RADS category 1, negative right breast.    BI-RADS category 6, known biopsy proven malignancy left breast.    11/10/2020 MRI guided needle  localization ?" left breast  FINDINGS:  The procedure was performed on a GE 3T magnet.  An IV was started in the  patient's arm prior to performing the procedure.  The patient was then  positioned prone on the MR table with her left breast within a dedicated  breast coil.  Limited axial T1 fat saturated sequences were obtained.  7  cc's of Gadavist was injected intravenously.  Additional axial T1 fat  saturated sequences were obtained. The lesion to be localized was targeted  at 2 o'clock in the left breast at middle depth. The skin was prepped with  alcohol.  1% lidocaine was used for superficial anesthesia.  Three 9-cm  wires were inserted using a lateral approach.    The localizing wire was secured in place and a sterile dressing was  applied to the entire wire entry site.  The patient tolerated the  procedure well and was transported to the nuclear medicine area in stable  condition.    POST LOCALIZATION MAMMOGRAM:  A post-localization mammogram was performed  to document wire placement.    CONCLUSION:  Successful MRI guided wire bracketing of a mass and associated non-mass  enhancement at 2:00 left breast using three 9 cm wires. The index mass  (posterior wire) is 3 cm deep to the skin surface, the most anterior non-  mass enhancement is 2 cm deep to the skin surface, and the middle  wire/target is 1 cm deep to the skin surface.    11/19/2020 [place] Left Breast limited  HISTORY:  Patient is seen for lump or thickening in the left breast.    STUDIES COMPARED:  The present examination has been compared to prior imaging studies  performed at [facility] on 10/07/2020  and 11/10/2020.    Technologist: Bedelia Person    ULTRASOUND FINDINGS:  Sonography was performed in the left breast in transverse and longitudinal  planes. There is an oval post- surgical fluid collection with  circumscribed margins measuring 2.8 x 1.0 x 2.3 cm in the axillary tail of  the left  breast.    No axillary  lymphadenopathy identified.    IMPRESSION / RECOMMENDATION:  Post- surgical fluid collection in the left breast is benign.    Ultrasound follow up in one week will be scheduled.    ASSESSMENT:  BI-RADS Category 2:  Benign Finding(s)    LABORATORY/PATHOLOGY: I performed a thorough review of the patient's most  recent laboratory, and pathology reports during this visit.      Left breast lumpectomy and sentinel node biopsy, 11/10/2020    FINAL PATHOLOGIC DIAGNOSIS:  A: Left breast, lumpectomy  -Invasive lobular carcinoma, histologic grade 2 (classic and pleomorphic),  pT3N0(sn), see synoptic report.  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  -Margins are negative for invasive and pleomorphic LCIS.  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  -Inked margins are negative for pleomorphic LCIS.  C: Left axillary sentinel lymph node, resection  -Two lymph nodes, negative for carcinoma (0/2).  -One lymph node with benign squamous lined cyst.  BREAST CANCER: SYNOPTIC REPORT:  (CAP Version 4.5.0.0, posted June 2021, based on AJCC 8th Edition)  Specimen/Procedure:  Excision  Lymph Node Sampling:  Sentinel lymph nodes  Specimen Laterality:  Left  Tumor Focality:  Unifocal  Invasive Tumor Type:  Invasive lobular carcinoma (mixed of classic and pleomorphic)  Invasive Tumor Size:  53 mm (maximum diameter)  Histologic Grade (Nottingham modification, Scarf-Bloom-Richardson System):  Grade 2  Nuclear Pleomorphism: Score 3  Mitotic rate: Score 1  Glandular/Tubular Differentiation: Score 3  Total score: 7  Lymphovascular Invasion:  Absent  Resection Margins For Invasive Tumor:  Margins uninvolved by invasive carcinoma  Distance from closest margin: Widely clear -greater than 10 mm; including  specimens B, which is negative for invasive carcinoma  Duct Carcinoma In Situ (DCIS):  Not identified  Lobular Carcinoma In Situ  (LCIS):  Present  LCIS Type:  Classic and pleomorphic (nuclear grades 2 and 3), mixed  LCIS Extent:  Extensive. Pleomorphic LCIS is not seen at the inked margins  Lymph Nodes Positive /Total Lymph Nodes Sampled: 0/2  Number of lymph nodes with macrometastases: 0  Number of lymph nodes with micrometastases: 0  Number of Lymph Nodes with Isolated Tumor Cells: 0  Pancytokeratin is negative.  Treatment Effect(s): Response to Presurgical (Neoadjuvant) Therapy in the  Breast:  Not applicable (no known presurgical therapy)  Therapy-related changes in lymph node:  Not applicable  Pathologic Stage Classification:  Primary Tumor (Invasive Carcinoma) (pT):  pT3: Tumor >50 mm in greatest dimension  Regional Lymph Nodes (pN):  (sn):   Sentinel node(s) evaluated.  pN0: No regional lymph node metastasis identified or ITCs only  Nipple:  Not applicable  Skin/dermis:  Not applicable  Skeletal Muscle:  Not applicable  Breast biomarker testing  Testing was performed and results were reported on a prior biopsy  ([id number]); results are outlined again below:  Estrogen receptor (ER): Positive:  Percentage of cells with nuclear positivity: 90%  Average intensity of staining: Strong  Progesterone receptor (PR): Positive:  Percentage of cells with nuclear positivity: 90%  Average intensity of staining: Strong  HER2 (IHC): Negative (score 0, ASCO/CAP 2018)  HER2 FISH: Negative    Core Needle Biopsy 09/2020:    FINAL PATHOLOGIC DIAGNOSIS:  Outside slides received [facility],  [place], [place]; "[id number]"; 07/23/2020):  A: Left breast, 2:00 o'clock, core biopsy (A):  -Invasive lobular carcinoma, provisional histologic (mBR) grade 2, see  comment.  -Lobular carcinoma in situ,  classic type.  Breast Needle Biopsy Tumor Synoptic Report:  Invasive tumor type: Invasive lobular carcinoma  Invasive tumor size: 13 mm (largest dimension in a core, multiple cores  involved)  Provisional histologic grade (Nottingham  modification,  Scarf-Bloom-Richardson): Grade 2  Nuclear Pleomorphism: Score 3  Mitotic rate: Score 1  Glandular/Tubular Differentiation: Score 3  Total score: 7  Ductal Carcinoma In Situ: Not identified  Ancillary Studies: A review of the outside immunohistochemical slides,  with  their appropriately reactive controls, showed the tumor to display the  following immunoprofile:  Estrogen receptor (ER): Positive:  Percentage of cells with nuclear positivity: 91-100%  Average intensity of staining: Strong  Progesterone receptor (PR): Positive:  Percentage of cells with nuclear positivity: 90%  Average intensity of staining: Strong  Her2/neu (IHC):  Negative (score 1+, ASCO/CAP 2018)  ADDENDUM:  IMMUNOHISTOCHEMISTRY RESULTS  [Performed on Unstained Slides Cut and Prepared at [place] on Outside Block  A1]:  Estrogen receptor (ER): Positive:  Percentage of cells with nuclear positivity: 90%  Average intensity of staining: Strong  Progesterone receptor (PR): Positive:  Percentage of cells with nuclear positivity: 90%  Average intensity of staining: Strong  HER2 (IHC): Negative (score 0, ASCO/CAP 2018)  HER2 (FISH): Pending  ADDENDUM:  HER2 FISH RESULTS:  [Performed on Unstained Slides Cut and Prepared at  from Outside Block  A1]  FINAL RESULT: HER2 NEGATIVE  HER2:CEP17 signal ratio = 1.1  Average number of HER2 signals per nucleus = 1.8  Average number of CEP17 signals per nucleus = 1.6      OncotypeDX, 12/01/2020      PHYSICAL EXAM:    Constitutional:  Well-developed, well-nourished and in no acute distress.  Head: Normocephalic, atraumatic.  Eyes: Sclerae are non-icteric.  Pupils are equal and round.  Extraocular  movements are intact.  Neurologic: CN II-XII are grossly intact.    Remainder of physical exam including breast exam not performed today due  to telehealth consultation.    IMPRESSION and RECOMMENDATIONS:    Patient is a 46 year old female with LEFT breast cancer, gr 2 ILC, s/p  breast conserving surgery and  SLNBx, presenting for adjuvant radiation  therapy. Her surgery was completed 4 months ago, and I recommend starting  adjuvant radiation soon given her delayed presentation.    We discussed the pathophysiology of breast cancer, the natural history,  prognosis, patterns of failure, and our current recommendations.    We talked with her specifically about the role of radiotherapy in her  management.  The rationale for the addition of whole breast radiation to a  lumpectomy was reviewed.  There have been several confirmatory randomized  trials which show the benefit in locoregional control with the addition of  radiotherapy to the whole breast.    After reviewing the rationale and indications for radiotherapy, we  reviewed her history and she has no contraindications to radiotherapy.  The alternatives to radiotherapy were then discussed with the patient.    We discussed the various dose and fractionation schemas for breast cancer  with the patient.  We then talked about the logistics associated with her  radiotherapy.  In terms of the planning process, a simulation CT scan  would be obtained.  During this session, a mold would be made around her  arms for reproducible treatments, and tattoos would be placed on the skin.  The daily treatments would take about 15 minutes (60 minutes in the  department) for up to six weeks.  The first several weeks  would include  radiation to the whole breast.  Depending on the pathology and risk  features, the last week may include directed radiation therapy to the  lumpectomy cavity plus a margin, referred to as a boost.    The side effect profile was reviewed with the patient.  In the acute  setting, this includes:  skin erythema, skin breakdown, skin irritation,  itching, pain, swelling, fatigue. The long-term side effects include:  hyperpigmentation, telangiectasia, breast fibrosis, asymmetry, lung  toxicity in the form of radiation pneumonitis, radiation fibrosis, cardiac  toxicity  if the tumor is left-sided, rib fracture, lymphedema, and  secondary malignancy. Prone position or DIBH technique will be considered  to reduce cardiac toxicity.    We reviewed the risk of pregnancy during radiation.  She understands that  if she is of child-bearing potential, she must use an adequate form of  contraception prior to and during radiation.  If she were to become  pregnant, she cannot undergo radiation and it will be discontinued.  If  she thinks she has become pregnant prior to the radiation or while on  treatment for radiation, she understands that she is to notify me  immediately.    The patient expressed understanding of the goals and side effect profile  of radiation.  Informed consent was obtained via telehealth platform.    Radiation requires intensive monitoring consisting of at least weekly  visits during treatment as radiation therapy can cause significant side  effects and toxicity.  These have been discussed with the patient at  length.    PLAN:  -CT simulation ordered, with pregnancy test prior.  -RT plan: Left WBI to 4256 cGy/16 fx followed by a 10 Gy/46f boost; prone  positioning recommended given large breast size and anterior tumor bed  location; 3DCT, breast tangents.  -RT should start soon as patient is now 16 weeks post-surgery.    LENGTH OF VISIT: Total time the attending physician personally spent in  pre-visit, intra-visit, and post-visit activities excluding separately  reportable services/procedures: 60 minutes.    MEDICAL NECESSITY: Adjuvant RT indicated as part of breast conservation  paradigm to improve local-regional control.    cc: KHeloise Ochoa MD and STenna Delaine MD    Signature derived from controlled access password  AAlice RiegerMD - PID [id number]/[id number] 3:42:19 PM

## 2021-03-11 NOTE — Consults (Addendum)
CONSULTING PHYSICIAN: Alice Rieger, M.D.    PATIENT: Kelsey Rubio RECORD NUMBER: [id number]    DATE OF BIRTH: 01-08-1976    DATE OF SERVICE: 03/10/2021    REQUESTING PHYSICIAN: Tenna Delaine, MD    REASON: 46 year old woman with clinically detected stage IB, pT3N0(sn),  HR+ HER2- Left breast cancer ?" Invasive Lobular Carcinoma ([place]), with low  oncotypeDX recurrence score of 8, s/p left lumpectomy and sentinel node  biopsy (11/10/2020), who is referred to establish care with radiation  oncology ?" via telehealth consultation today.    DIAGNOSIS: Stage IB, pT3N0(sn), invasive lobular carcinoma of the Left  breast; grade 2, ER 3+ 90%, PR 3+ 90%, HER2 IHC 0, FISH neg; OncotypeDX  RS=8  Local therapy: Left lumpectomy and sentinel node biopsy (11/10/2020)    HISTORY OF PRESENT ILLNESS:    Patient initially palpated a breast mass, which led to diagnostic  mammogram and ultrasound in July 2022 (no reports available for review  today) ?" and core needle biopsy in September 2022 demonstrated [place].  Imaging has been repeated at [facility] and biopsy/surgery results are described  in detail below.    Today, patient reports numbness in the left upper arm/axilla area, and  firmness near lumpectomy incision area. Denies new lumps, skin or nipple  changes. She has recovered well from surgery on 1Nov2022, with normal Left  shoulder ROM.    Pre-menopausal  Pregnancies G3 P2  Prior use of hormonal Birth Control: OCP x 10 yrs  Prior use of HRT: no    PAST MEDICAL HISTORY:  Malignant neoplasm of upper-outer quadrant of left breast in female, ER+  Asthma  Hypertension  Palpitations    Lupus, scleroderma or other connective tissue disorders: no    CARDIAC DEVICE: no    PAST SURGICAL HISTORY:    GYN HISTORY:  G3   P3  A  Menarche: 53  Age at first pregnancy: 49  LMP:  Contraceptives: None  Menopause: N/A    CHEMOTHERAPY: No previous chemotherapy    RADIATION:  No previous radiation    MEDICATIONS:  Acetaminophen (Tylenol) 387m  tablet  Albuterol 108 (90 Base) mcg/act inhaler  Beclomethasone diprop hfa (Qvar Redihaler In)  Gabapentin (Neurontin) 300 mg tablet  Hydrochlorothiazide (Hydrodiuril) 25 mg tablet  Letrozole (Femara) 2.570mtablet  Lidocaine-prilocaine (Emla) 2.5-2.5% cream  Montelukast (Singulair) 1079mablet    ALLERGIES:  Aspirin ?" wheezing and stuffy nose  Ibuprofen ?" wheezing and stuffy nose    FAMILY HISTORY:  No family history on file.    SOCIAL HISTORY: The patient is a non-smoker;  consumes 2 standard drinks  of alcohol/week; 2 glasses of wine per week.    denies drug use.  [email or web address]    REVIEW OF SYSTEMS:   ROS Constitutional - Denies lack of appetite, fatigue  and change in weight. ROS Eyes - Denies visual difficulties. ROS Neck -  Denies neck masses, muscle weakness and neck pain. ROS Breasts - Denies  breast masses, nipple discharge, nipple inversion and pain. ROS  Cardiovascular - Denies chest pain. ROS Respiratory - Denies cough and  dyspnea. ROS Gastrointestinal - Denies abdominal pain. ROS Genitourinary  (F) - Denies dysuria, frequency and genital masses. ROS Musculoskeletal -  Denies bone pain, joint pain, muscle weakness and decreased range of  motion. ROS Neurologic - Denies disorientation, dizziness, abnormal gait  and headaches. ROS Psychiatric - Denies depression. ROS Endocrine - Denies  diabetes, hot flashes,  menstrual irregularities and thyroid disease. ROS  Hematologic/Lymphatic - Denies easy bruising and tender or enlarged lymph  nodes.    CHAPERONE DOCUMENTATION:    FATIGUE SCORE: 0    PERFORMANCE:  0 - Fully active, able to carry on all predisease activities  without restrictions. (ECOG)    REVIEW OF RADIOLOGY: I performed a thorough review of the patient's most  recent radiographic reports during this visit. I also personally reviewed  and interpreted the recent imaging for this patient.    10/07/2020 Diagnostic mammogram with digital breast tomosynthesis  ?"  bilateral  DIAGNOSTIC MAMMOGRAM WITH DIGITAL BREAST TOMOSYNTHESIS FINDINGS:  The breasts are heterogeneously dense, which may obscure small masses.    In the right breast there are no masses, asymmetries, or suspicious  calcifications.    There is an irregular mass with spiculated margins and associated marker  clip in the left breast at 2 o'clock located 9 centimeters from the  nipple.    ULTRASOUND FINDINGS:  Sonography was performed in the left breast in transverse and longitudinal  planes. Ultrasound demonstrates an irregular mass measuring 2.0 x 1.3 x  2.0 cm in the left breast at 2 o'clock located 9 centimeters from the  nipple.  The mass is poorly delineated on mammography and ultrasound, and  is known to be substantially larger than these ultrasound measurements  based on 09/30/2020 MRI.    No axillary lymphadenopathy identified.    IMPRESSION / RECOMMENDATION:  There is no evidence of malignancy in the right breast.    Mass in the left breast is a known malignancy.    Clinical management of known malignancy is recommended.    ASSESSMENT:  BI-RADS Category 6:  Known Biopsy Proven Malignancy    09/30/2020 B/L Breast MRI:    FINDINGS:  The technical quality of this study is considered adequate to make a final  assessment and recommendation.    There is heterogeneous fibroglandular tissue bilaterally and moderate  background enhancement.    Coronal T2 sequences demonstrates normal bilateral axillary nodes.    Axial T2 sequence demonstrates no large cysts or fluid collections.    Non fat saturated T1 sequence demonstrates no fat containing lesions.  Signal void in the left breast at site of biopsy marking clip seen on  mammogram.    High resolution post contrast sequence demonstrates no internal mammary  lymphadenopathy.    Regarding the right breast: There are no masses, architectural distortion  or suspicious areas of enhancement.    Regarding the left breast: Known invasive lobular carcinoma in the  left  breast is seen as an irregular mass with spiculated and associated focal  non-mass enhancement at the 2 o'clock position, 9 cm from the nipple  measuring 6.2 x 3.7 x 2.3 cm (post contrast dynamic image 102). Internal  enhancement is heterogeneous. Kinetic assessment demonstrates fast initial  enhancement followed by washout in the delayed portions of the curve.    IMPRESSION:  1.Known invasive lobular carcinoma in the left breast at 2:00 measures 6.2  x 3.7 x 2.3 cm.    2. No lymphadenopathy.    Recommendations: Diagnostic mammogram and left breast ultrasound are  scheduled for 10/07/2020.    BI-RADS category 1, negative right breast.    BI-RADS category 6, known biopsy proven malignancy left breast.    11/10/2020 MRI guided needle localization ?" left breast  FINDINGS:  The procedure was performed on a GE 3T magnet.  An IV was started in the  patient's arm  prior to performing the procedure.  The patient was then  positioned prone on the MR table with her left breast within a dedicated  breast coil.  Limited axial T1 fat saturated sequences were obtained.  7  cc's of Gadavist was injected intravenously.  Additional axial T1 fat  saturated sequences were obtained. The lesion to be localized was targeted  at 2 o'clock in the left breast at middle depth. The skin was prepped with  alcohol.  1% lidocaine was used for superficial anesthesia.  Three 9-cm  wires were inserted using a lateral approach.    The localizing wire was secured in place and a sterile dressing was  applied to the entire wire entry site.  The patient tolerated the  procedure well and was transported to the nuclear medicine area in stable  condition.    POST LOCALIZATION MAMMOGRAM:  A post-localization mammogram was performed  to document wire placement.    CONCLUSION:  Successful MRI guided wire bracketing of a mass and associated non-mass  enhancement at 2:00 left breast using three 9 cm wires. The index mass  (posterior wire) is 3 cm deep to  the skin surface, the most anterior non-  mass enhancement is 2 cm deep to the skin surface, and the middle  wire/target is 1 cm deep to the skin surface.    11/19/2020 [place] Left Breast limited  HISTORY:  Patient is seen for lump or thickening in the left breast.    STUDIES COMPARED:  The present examination has been compared to prior imaging studies  performed at [facility] on 10/07/2020  and 11/10/2020.    Technologist: Bedelia Person    ULTRASOUND FINDINGS:  Sonography was performed in the left breast in transverse and longitudinal  planes. There is an oval post- surgical fluid collection with  circumscribed margins measuring 2.8 x 1.0 x 2.3 cm in the axillary tail of  the left breast.    No axillary lymphadenopathy identified.    IMPRESSION / RECOMMENDATION:  Post- surgical fluid collection in the left breast is benign.    Ultrasound follow up in one week will be scheduled.    ASSESSMENT:  BI-RADS Category 2:  Benign Finding(s)    LABORATORY/PATHOLOGY: I performed a thorough review of the patient's most  recent laboratory, and pathology reports during this visit.      Left breast lumpectomy and sentinel node biopsy, 11/10/2020    FINAL PATHOLOGIC DIAGNOSIS:  A: Left breast, lumpectomy  -Invasive lobular carcinoma, histologic grade 2 (classic and pleomorphic),  pT3N0(sn), see synoptic report.  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  -Margins are negative for invasive and pleomorphic LCIS.  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  -Inked margins are negative for pleomorphic LCIS.  C: Left axillary sentinel lymph node, resection  -Two lymph nodes, negative for carcinoma (0/2).  -One lymph node with benign squamous lined cyst.  BREAST CANCER: SYNOPTIC REPORT:  (CAP Version 4.5.0.0, posted June 2021, based on AJCC 8th Edition)  Specimen/Procedure:  Excision  Lymph Node  Sampling:  Sentinel lymph nodes  Specimen Laterality:  Left  Tumor Focality:  Unifocal  Invasive Tumor Type:  Invasive lobular carcinoma (mixed of classic and pleomorphic)  Invasive Tumor Size:  53 mm (maximum diameter)  Histologic Grade (Nottingham modification, Scarf-Bloom-Richardson System):  Grade 2  Nuclear Pleomorphism: Score 3  Mitotic rate: Score 1  Glandular/Tubular Differentiation: Score 3  Total score: 7  Lymphovascular Invasion:  Absent  Resection Margins For Invasive Tumor:  Margins uninvolved by invasive carcinoma  Distance from closest margin: Widely clear -greater than 10 mm; including  specimens B, which is negative for invasive carcinoma  Duct Carcinoma In Situ (DCIS):  Not identified  Lobular Carcinoma In Situ (LCIS):  Present  LCIS Type:  Classic and pleomorphic (nuclear grades 2 and 3), mixed  LCIS Extent:  Extensive. Pleomorphic LCIS is not seen at the inked margins  Lymph Nodes Positive /Total Lymph Nodes Sampled: 0/2  Number of lymph nodes with macrometastases: 0  Number of lymph nodes with micrometastases: 0  Number of Lymph Nodes with Isolated Tumor Cells: 0  Pancytokeratin is negative.  Treatment Effect(s): Response to Presurgical (Neoadjuvant) Therapy in the  Breast:  Not applicable (no known presurgical therapy)  Therapy-related changes in lymph node:  Not applicable  Pathologic Stage Classification:  Primary Tumor (Invasive Carcinoma) (pT):  pT3: Tumor >50 mm in greatest dimension  Regional Lymph Nodes (pN):  (sn):   Sentinel node(s) evaluated.  pN0: No regional lymph node metastasis identified or ITCs only  Nipple:  Not applicable  Skin/dermis:  Not applicable  Skeletal Muscle:  Not applicable  Breast biomarker testing  Testing was performed and results were reported on a prior biopsy  ([id number]); results are outlined again below:  Estrogen receptor (ER): Positive:  Percentage of cells with nuclear positivity: 90%  Average intensity of staining: Strong  Progesterone receptor  (PR): Positive:  Percentage of cells with nuclear positivity: 90%  Average intensity of staining: Strong  HER2 (IHC): Negative (score 0, ASCO/CAP 2018)  HER2 FISH: Negative    Core Needle Biopsy 09/2020:    FINAL PATHOLOGIC DIAGNOSIS:  Outside slides received [facility],  [place], [place]; "[id number]"; 07/23/2020):  A: Left breast, 2:00 o'clock, core biopsy (A):  -Invasive lobular carcinoma, provisional histologic (mBR) grade 2, see  comment.  -Lobular carcinoma in situ, classic type.  Breast Needle Biopsy Tumor Synoptic Report:  Invasive tumor type: Invasive lobular carcinoma  Invasive tumor size: 13 mm (largest dimension in a core, multiple cores  involved)  Provisional histologic grade (Nottingham modification,  Scarf-Bloom-Richardson): Grade 2  Nuclear Pleomorphism: Score 3  Mitotic rate: Score 1  Glandular/Tubular Differentiation: Score 3  Total score: 7  Ductal Carcinoma In Situ: Not identified  Ancillary Studies: A review of the outside immunohistochemical slides,  with  their appropriately reactive controls, showed the tumor to display the  following immunoprofile:  Estrogen receptor (ER): Positive:  Percentage of cells with nuclear positivity: 91-100%  Average intensity of staining: Strong  Progesterone receptor (PR): Positive:  Percentage of cells with nuclear positivity: 90%  Average intensity of staining: Strong  Her2/neu (IHC):  Negative (score 1+, ASCO/CAP 2018)  ADDENDUM:  IMMUNOHISTOCHEMISTRY RESULTS  [Performed on Unstained Slides Cut and Prepared at [place] on Outside Block  A1]:  Estrogen receptor (ER): Positive:  Percentage of cells with nuclear positivity: 90%  Average intensity of staining: Strong  Progesterone receptor (PR): Positive:  Percentage of cells with nuclear positivity: 90%  Average intensity of staining: Strong  HER2 (IHC): Negative (score 0, ASCO/CAP 2018)  HER2 (FISH): Pending  ADDENDUM:  HER2 FISH RESULTS:  [Performed on Unstained Slides Cut and Prepared at  [place] from Outside Block  A1]  FINAL RESULT: HER2 NEGATIVE  HER2:CEP17 signal ratio = 1.1  Average number of HER2 signals per nucleus = 1.8  Average number of CEP17 signals per nucleus = 1.6  OncotypeDX, 12/01/2020      PHYSICAL EXAM:    Constitutional:  Well-developed, well-nourished and in no acute distress.  Head: Normocephalic, atraumatic.  Eyes: Sclerae are non-icteric.  Pupils are equal and round.  Extraocular  movements are intact.  Neurologic: CN II-XII are grossly intact.    Remainder of physical exam including breast exam not performed today due  to telehealth consultation.    IMPRESSION and RECOMMENDATIONS:    Patient is a 46 year old female with LEFT breast cancer, gr 2 ILC, s/p  breast conserving surgery and SLNBx, presenting for adjuvant radiation  therapy. Her surgery was completed 4 months ago, and I recommend starting  adjuvant radiation soon given her delayed presentation.    We discussed the pathophysiology of breast cancer, the natural history,  prognosis, patterns of failure, and our current recommendations.    We talked with her specifically about the role of radiotherapy in her  management.  The rationale for the addition of whole breast radiation to a  lumpectomy was reviewed.  There have been several confirmatory randomized  trials which show the benefit in locoregional control with the addition of  radiotherapy to the whole breast.    After reviewing the rationale and indications for radiotherapy, we  reviewed her history and she has no contraindications to radiotherapy.  The alternatives to radiotherapy were then discussed with the patient.    We discussed the various dose and fractionation schemas for breast cancer  with the patient.  We then talked about the logistics associated with her  radiotherapy.  In terms of the planning process, a simulation CT scan  would be obtained.  During this session, a mold would be made around her  arms for reproducible treatments, and tattoos would be  placed on the skin.  The daily treatments would take about 15 minutes (60 minutes in the  department) for up to six weeks.  The first several weeks would include  radiation to the whole breast.  Depending on the pathology and risk  features, the last week may include directed radiation therapy to the  lumpectomy cavity plus a margin, referred to as a boost.    The side effect profile was reviewed with the patient.  In the acute  setting, this includes:  skin erythema, skin breakdown, skin irritation,  itching, pain, swelling, fatigue. The long-term side effects include:  hyperpigmentation, telangiectasia, breast fibrosis, asymmetry, lung  toxicity in the form of radiation pneumonitis, radiation fibrosis, cardiac  toxicity if the tumor is left-sided, rib fracture, lymphedema, and  secondary malignancy. Prone position or DIBH technique will be considered  to reduce cardiac toxicity.    We reviewed the risk of pregnancy during radiation.  She understands that  if she is of child-bearing potential, she must use an adequate form of  contraception prior to and during radiation.  If she were to become  pregnant, she cannot undergo radiation and it will be discontinued.  If  she thinks she has become pregnant prior to the radiation or while on  treatment for radiation, she understands that she is to notify me  immediately.    The patient expressed understanding of the goals and side effect profile  of radiation.  Informed consent was obtained via telehealth platform.    Radiation requires intensive monitoring consisting of at least weekly  visits during treatment as radiation therapy can cause significant side  effects and toxicity.  These have been discussed with the patient at  length.  PLAN:  -CT simulation ordered, with pregnancy test prior.  -RT plan: Left WBI to 4256 cGy/16 fx followed by a 10 Gy/46f boost; prone  positioning recommended given large breast size and anterior tumor bed  location; 3DCT, breast  tangents.  -RT should start soon as patient is now 16 weeks post-surgery.    LENGTH OF VISIT: Total time the attending physician personally spent in  pre-visit, intra-visit, and post-visit activities excluding separately  reportable services/procedures: 60 minutes.    MEDICAL NECESSITY: Adjuvant RT indicated as part of breast conservation  paradigm to improve local-regional control.    cc: KHeloise Ochoa MD and STenna Delaine MD    Signature derived from controlled access password  AAlice RiegerMD - PID [id number]/02/2021 7:04:58 AM

## 2021-03-16 NOTE — Telephone Encounter (Signed)
Called patient to schedule ZOLADEX. No answer, left message to call us back.

## 2021-03-17 NOTE — Telephone Encounter (Signed)
3/8 @ 10:32  Called patient to schedule Zoladex for Kelsey Rubio location, no answer. Left message for call back.         Adela Ports  [facility] Scheduling; Felipa [place]ory 1 hour ago (8:58 AM)     LG  Approved to [place] - Referral [id number]

## 2021-03-18 ENCOUNTER — Encounter (HOSPITAL_BASED_OUTPATIENT_CLINIC_OR_DEPARTMENT_OTHER): Payer: Self-pay | Admitting: Hematology & Oncology

## 2021-03-18 NOTE — Telephone Encounter (Signed)
Patient called in regards to ZOLADEX injection. Stated she was under the impression that was to start consecutively with XRT. Stated she would call us when ready to schedule.

## 2021-03-30 ENCOUNTER — Encounter (INDEPENDENT_AMBULATORY_CARE_PROVIDER_SITE_OTHER): Payer: Self-pay

## 2021-03-30 NOTE — Telephone Encounter (Signed)
Call Center pt does not qualify for obesity med consult or SLIM - BMI is 26 and no comorbidities.  Ok to route back to me.

## 2021-03-31 ENCOUNTER — Other Ambulatory Visit: Payer: Self-pay

## 2021-04-02 ENCOUNTER — Ambulatory Visit (HOSPITAL_BASED_OUTPATIENT_CLINIC_OR_DEPARTMENT_OTHER): Payer: Self-pay

## 2021-04-10 ENCOUNTER — Ambulatory Visit
Admit: 2021-04-10 | Discharge: 2021-04-10 | Disposition: A | Discharge status: Home / Self Care | Payer: Medicaid Other | Attending: Radiation Oncology | Admitting: Radiation Oncology

## 2021-04-10 ENCOUNTER — Ambulatory Visit (HOSPITAL_BASED_OUTPATIENT_CLINIC_OR_DEPARTMENT_OTHER): Payer: Medicaid Other

## 2021-04-19 ENCOUNTER — Ambulatory Visit (HOSPITAL_BASED_OUTPATIENT_CLINIC_OR_DEPARTMENT_OTHER): Payer: Self-pay

## 2021-05-10 ENCOUNTER — Ambulatory Visit (HOSPITAL_BASED_OUTPATIENT_CLINIC_OR_DEPARTMENT_OTHER): Payer: Medicaid Other

## 2021-05-28 ENCOUNTER — Other Ambulatory Visit: Payer: Self-pay

## 2021-05-28 ENCOUNTER — Telehealth (HOSPITAL_BASED_OUTPATIENT_CLINIC_OR_DEPARTMENT_OTHER): Payer: Self-pay

## 2021-05-28 NOTE — Telephone Encounter (Signed)
Faxed last progress note with Dr. Aurea Graff to [facility] [place] to fax to [phone] on this date and received fax confirmation sent successfully.

## 2021-05-28 NOTE — Telephone Encounter (Signed)
Dr Benjie Karvonen last office visit and initial consult note faxed to [facility]

## 2021-05-28 NOTE — Telephone Encounter (Addendum)
[facility] Call [place] Message for MD/RN:    Incoming call received from: [facility]     Provider: Dr. Aurea Graff     Reason for call: Requesting progress notes. Please fax to [phone].    Return call requested: If needed     Best callback number: [phone] EXT. [place]

## 2021-05-28 NOTE — Telephone Encounter (Signed)
[facility] Call [place] Message for MD/RN:    Incoming call received from: [facility]     Provider: Dr. Benjie Karvonen    Reason for call: Requesting progress notes. Please fax to [phone].    Return call requested: If needed     Best callback number: [phone] EXT. [place]

## 2021-06-04 ENCOUNTER — Ambulatory Visit (HOSPITAL_BASED_OUTPATIENT_CLINIC_OR_DEPARTMENT_OTHER): Payer: MEDICAID

## 2021-06-04 ENCOUNTER — Ambulatory Visit (HOSPITAL_BASED_OUTPATIENT_CLINIC_OR_DEPARTMENT_OTHER): Payer: MEDICAID | Admitting: Hematology & Oncology

## 2021-06-18 ENCOUNTER — Telehealth (HOSPITAL_BASED_OUTPATIENT_CLINIC_OR_DEPARTMENT_OTHER): Payer: Self-pay

## 2021-06-18 NOTE — Telephone Encounter (Signed)
Received staff message from [facility] stating I spoke with this patient regarding getting her rescheduled and she let me know that her insurance was updated and she is no longer in network with [facility] and that she had [place]. She also said that she was to meet with Dr. Benjie Karvonen at some point but because of the switch, she wouldn't be able to but she was curious to see if she still needs to be seen by a surgeon.     Patient notified that she doesn't need to see a surgeon unless she has a surgical concern and that if she has any concerns in the future she can ask her medical oncologist to refer her to an [facility] copy.

## 2023-04-22 IMAGING — US US AXILLARY BIL ([facility])
1 series · 13 of 20 positions shown · non-contrast
Comparison: The present examination has been compared to prior imaging studies.

HISTORY: Patient is 46 years old and is seen for diagnostic exam and large axillary lymph nodes in both axillae. The patient has a history of left stereotactic core biopsy in January 2020 - fibroadenoma - Left [DATE]. The patient has no personal history of cancer. The patient has the following family history of breast cancer:  sister, at age 40, breast cancer.
TECHNIQUE: Soft tissue sonography of both axilla was performed.

[Series 1: us axillary bil (magview) · 13 of 20 slices shown]
[im 1/20]
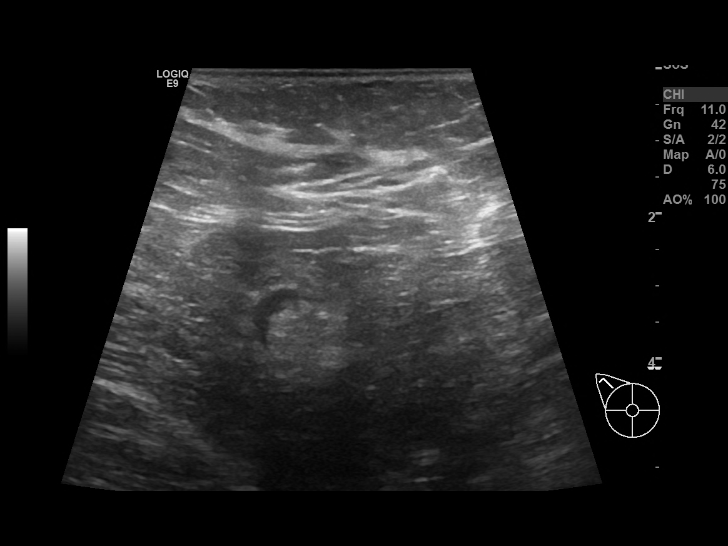
[im 3/20]
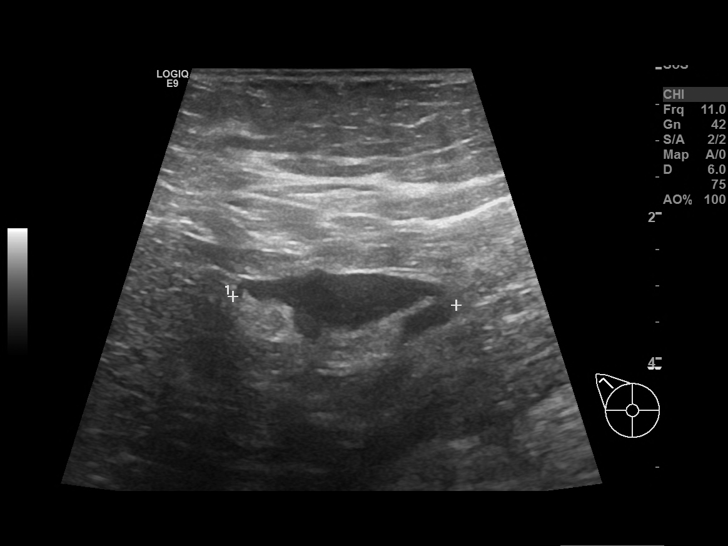
[im 4/20]
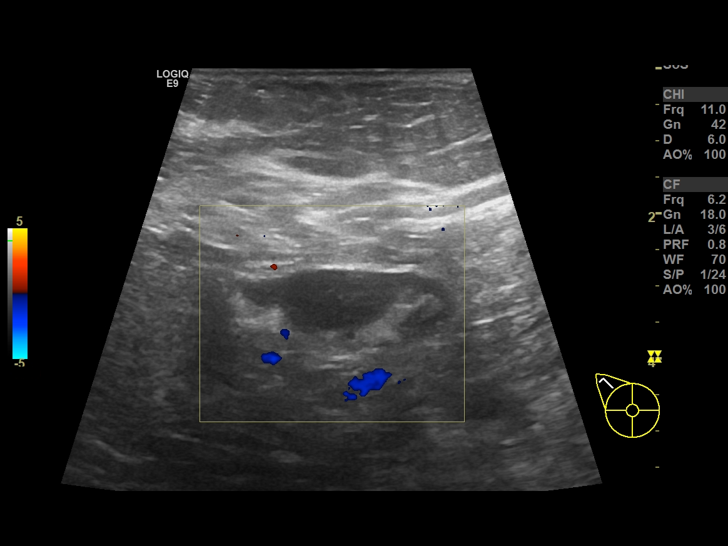
[im 6/20]
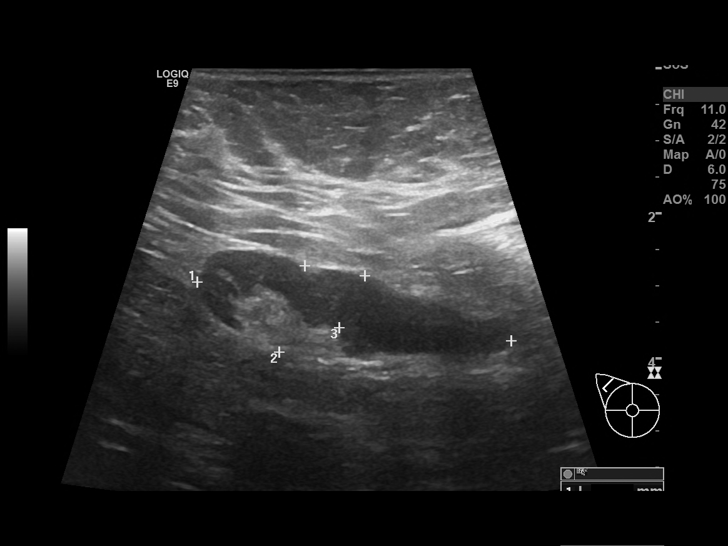
[im 7/20]
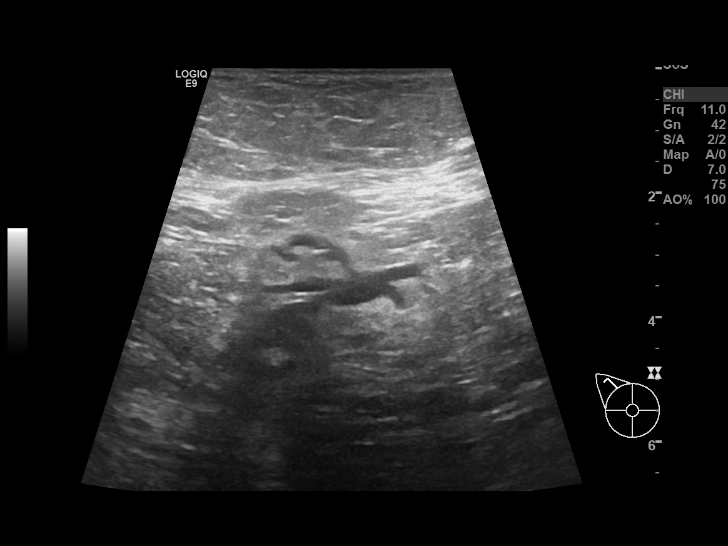
[im 9/20]
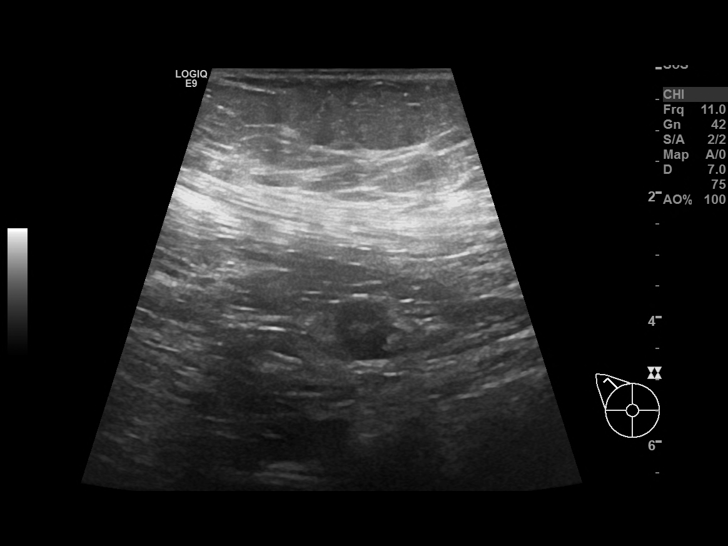
[im 11/20]
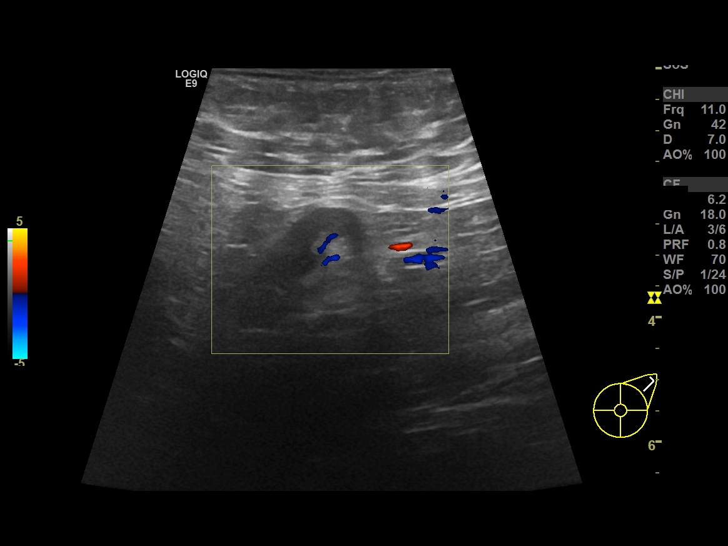
[im 12/20]
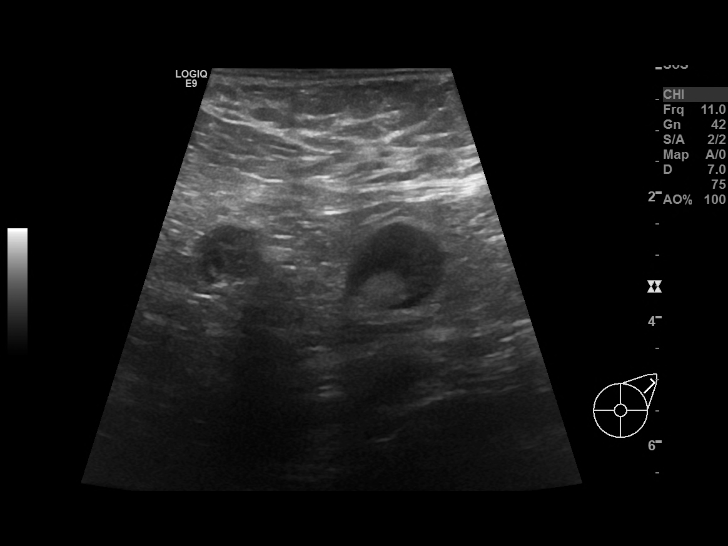
[im 14/20]
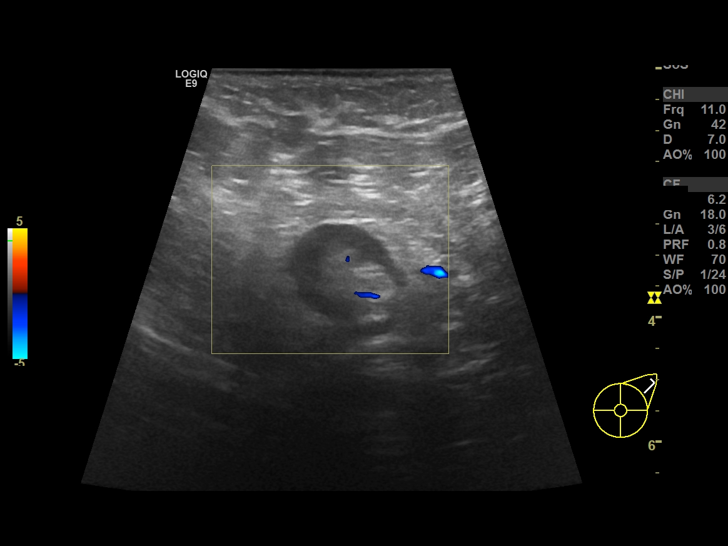
[im 15/20]
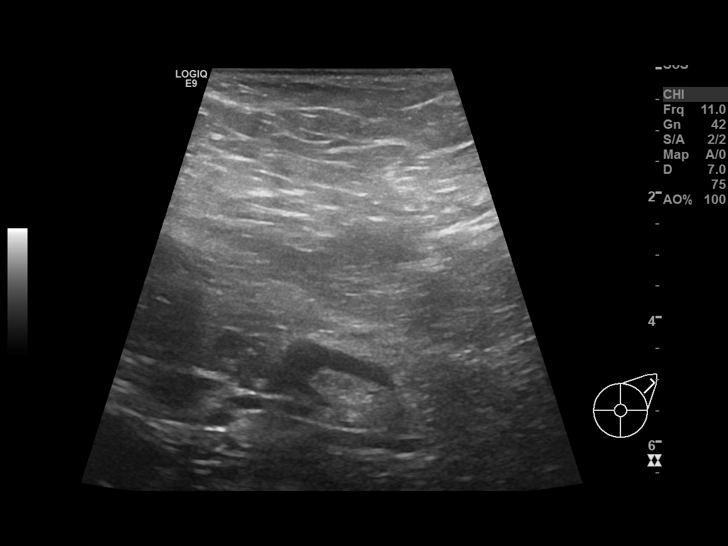
[im 17/20]
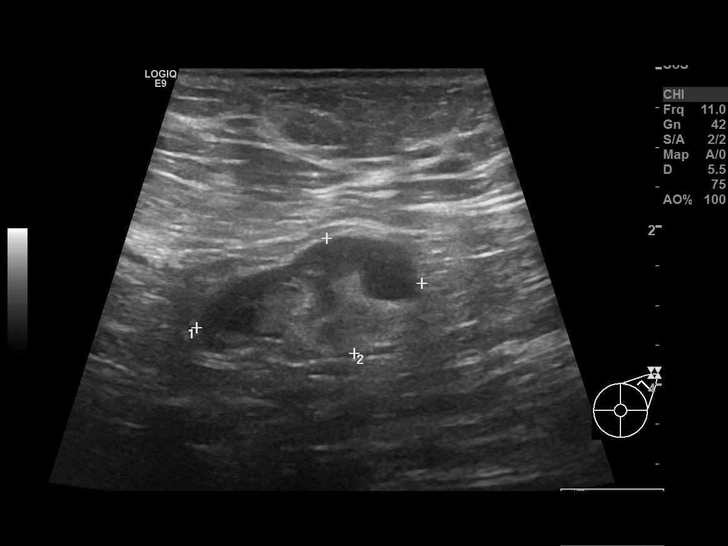
[im 18/20]
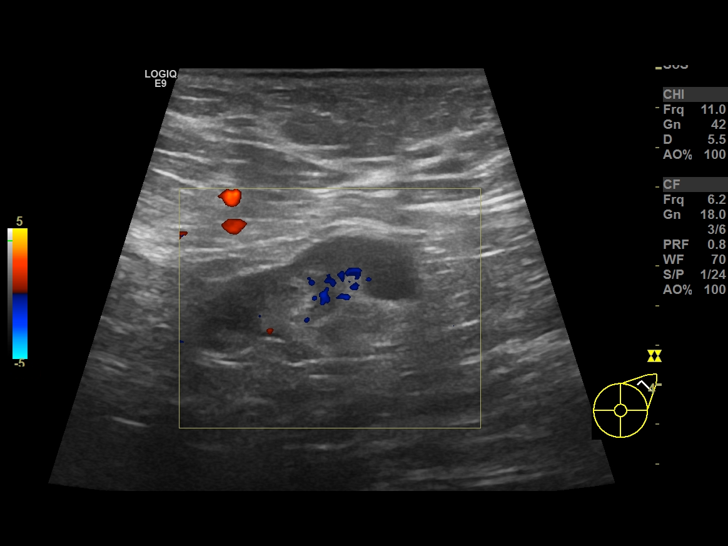
[im 20/20]
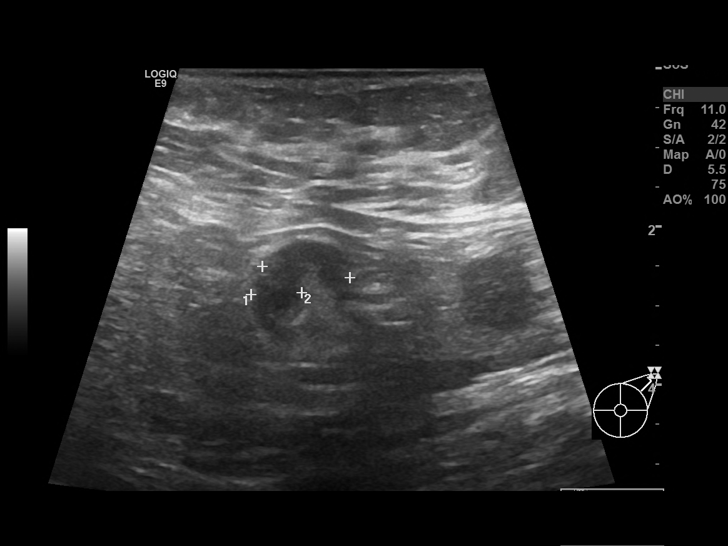

[13 of 20 positions shown; findings below may reference images not displayed]

ULTRASOUND FINDINGS:

There are enlarged bilateral axillary lymph nodes. For example there is a right axilla lymph node measuring 44 x 12 x 31 mm with an expanded cortex measuring up to 8 mm.

Again seen is the left axilla lymph node currently measuring 29 x 15 x 13 mm also with cortical expansion up to 6 mm. Previous dimensions of this left axilla lymph node with 25 x 12 x 20 mm.
IMPRESSION: Enlarged bilateral axillary lymph nodes for which an ultrasound guided biopsy is recommended for tissue diagnosis. Recommend biopsy of the most accessible lymph node. This may well be the large right axilla lymph node. The same process is suspected bilaterally.

Findings and recommendations were discussed with the patient at the time of her visit. She was given a copy of her results. 

The patient did relate that her history is complicated by a history of deep venous anastomosis in [REDACTED] for which she is on blood thinning medicines. These would likely need to be held prior to the procedure if possible.

BI-RADS Category 4: Suspicious

## 2023-05-21 IMAGING — US BX AXILLARY BREAST LYMPH NODE BILATERAL ([facility])
1 series · 13 of 22 positions shown · non-contrast
Comparison: 04/19/2021

INDICATION: Enlarged axillary lymph nodes.

[Series 1: bx axillary breast lymph node bilateral ([facility]) · 13 of 22 slices shown]
[im 1/22]
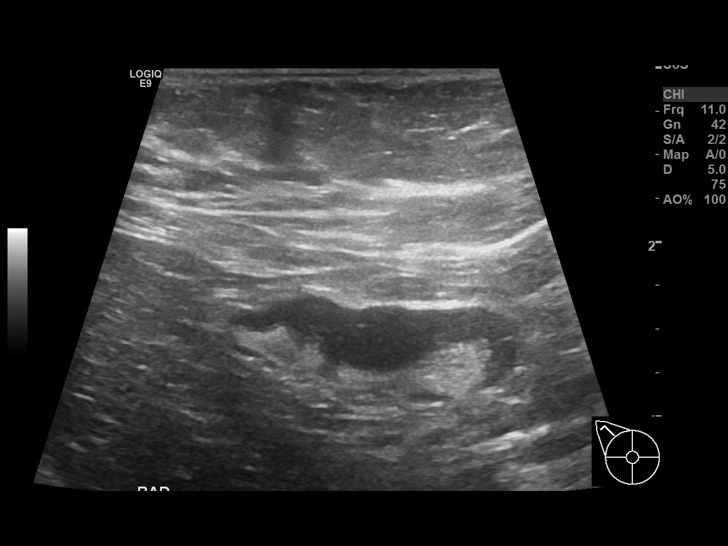
[im 3/22]
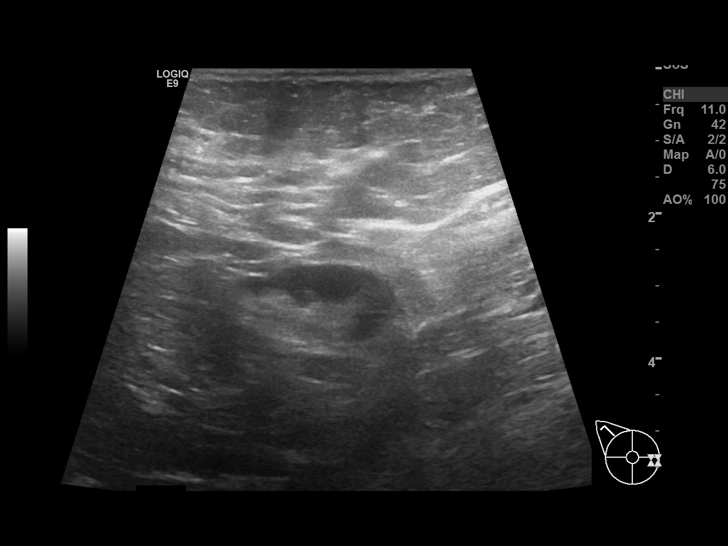
[im 5/22]
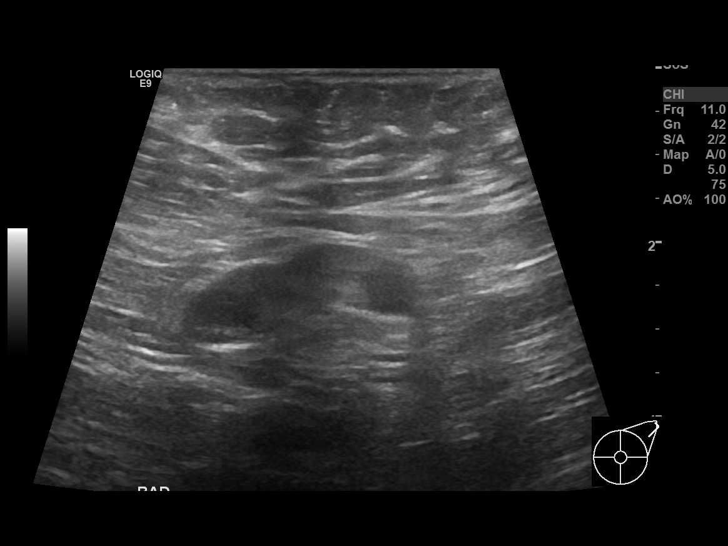
[im 6/22]
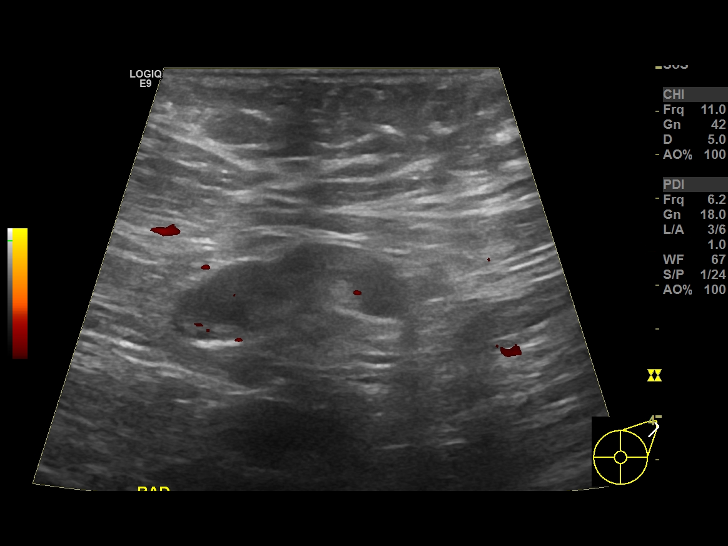
[im 8/22]
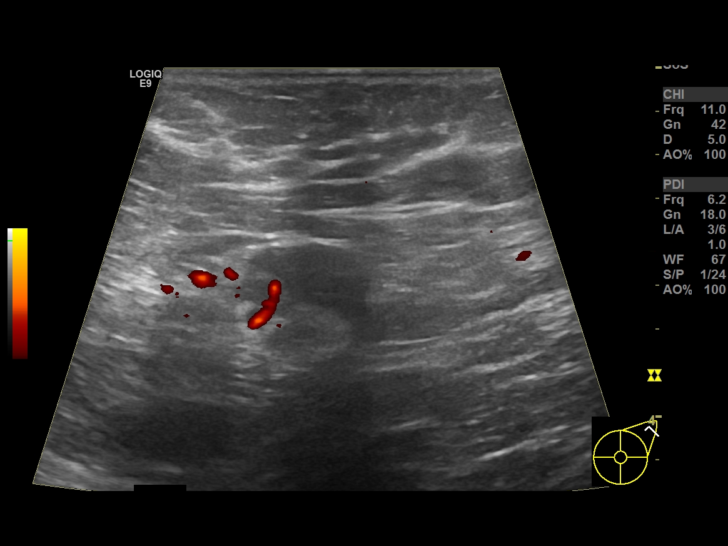
[im 10/22]
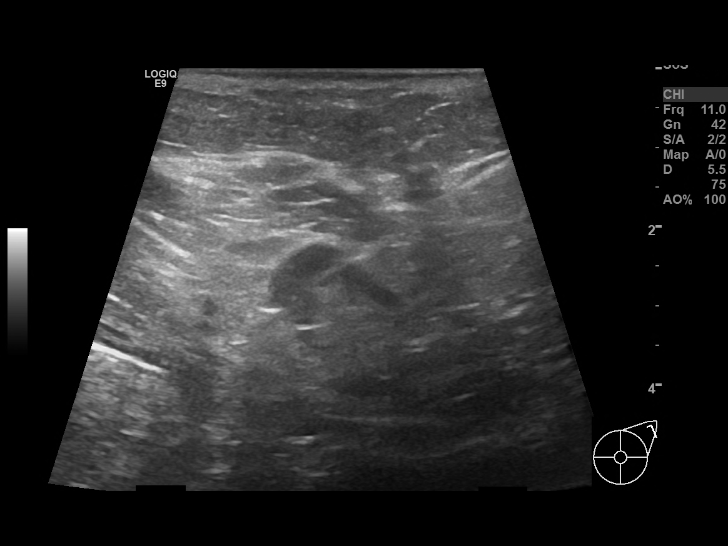
[im 12/22]
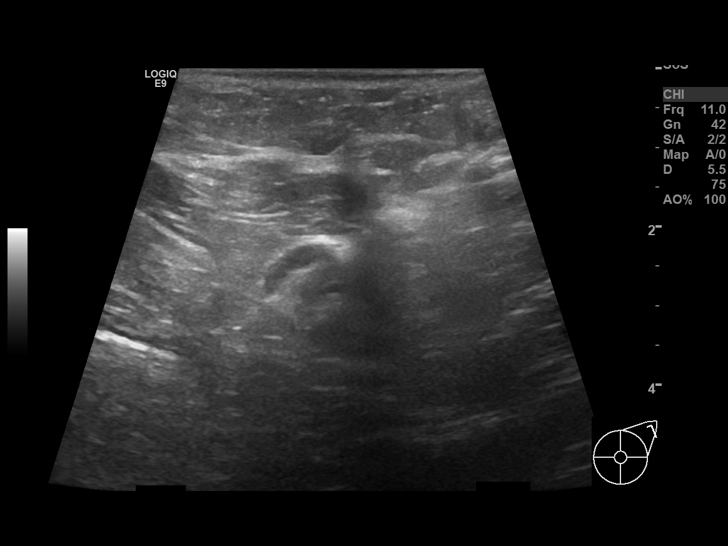
[im 13/22]
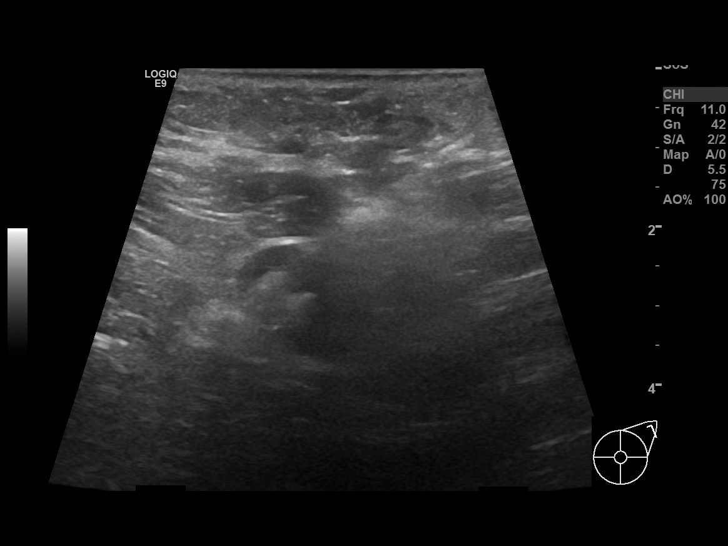
[im 15/22]
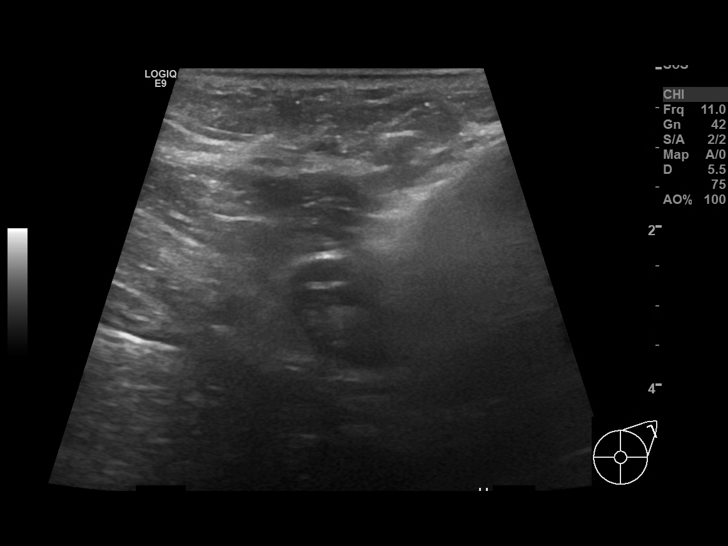
[im 17/22]
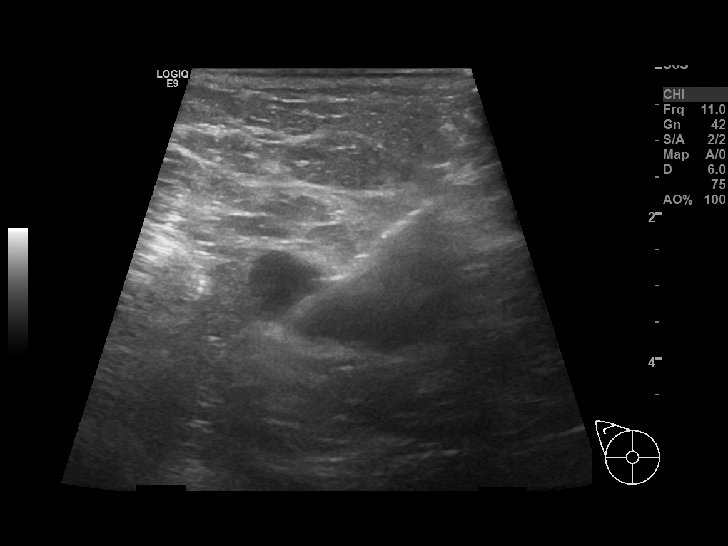
[im 18/22]
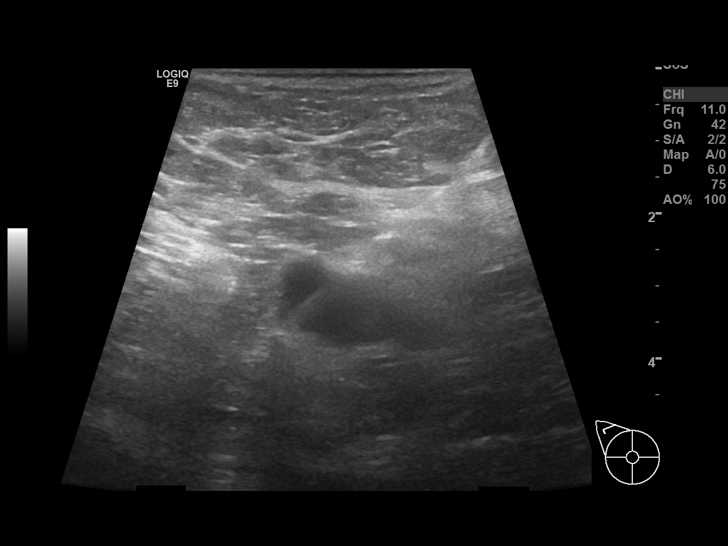
[im 20/22]
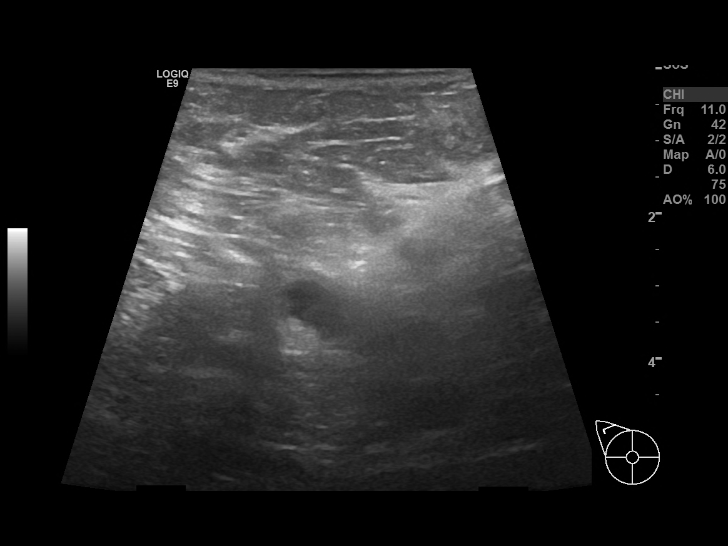
[im 22/22]
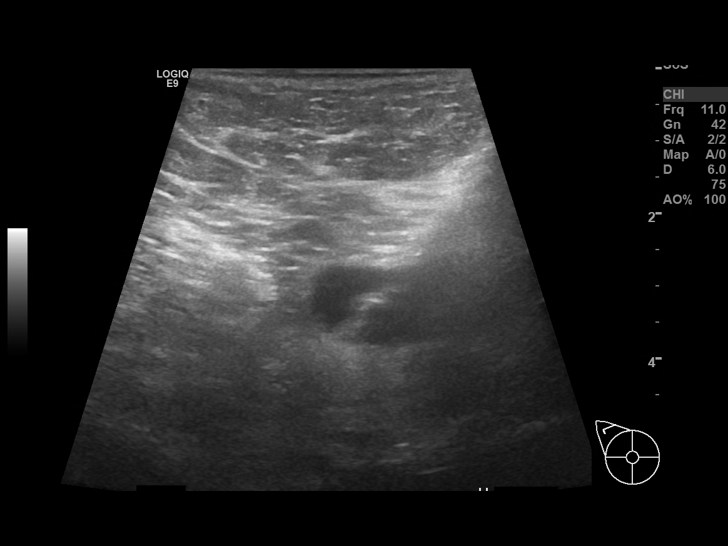

[13 of 22 positions shown; findings below may reference images not displayed]

PROCEDURE:

PATIENT CONSENT: Prior to the procedure risks, complications,alternatives and a description of the procedure were discussed. All questions were answered.  Informed consent was obtained and signed.  

PROCEDURE DESCRIPTION:  

An ultrasound guided biopsy using real-time ultrasound was performed for the 4.4 cm enlarged lymph node in the right axilla.  This was described on the previous ultrasound report.  The skin was prepped in the usual manner.  Local anesthetic was administered to the access site.  A skin nick was made in the breast.  A 18 gauge biopsy needle was placed adjacent to the abnormality under ultrasound guidance.  Once the needle was documented to be in the correct location, three specimens were obtained using the Marquee biopsy needle.  A Hydromark clip was inserted into the biopsy cavity.  A skin adhesive was applied to the access site.  Post procedure digital mammographic imaging demonstrates the clip at the targeted area.  The specimens were sent to the laboratory for pathological analysis.  

An ultrasound guided biopsy using real-time ultrasound was performed for the 2.9 cm lymph node in the left axilla.  This was described on the previous ultrasound report.  The skin was prepped in the usual manner.  Local anesthetic was administered to the access site.  A skin nick was made in the breast.  A 14 gauge biopsy needle was placed adjacent to the abnormality under ultrasound guidance.  Once the needle was documented to be in the correct location, three specimens were obtained using the Marquee biopsy needle.  A Hydromark clip was inserted into the biopsy cavity.  A skin adhesive was applied to the access site.  Post procedure digital mammographic imaging demonstrates the clip at the targeted area.  The specimens were sent to the laboratory for pathological analysis.  

A female technologist was present throughout the procedure.
IMPRESSION: BENIGN

Ultrasound guided biopsy of the 4.4 cm enlarged lymph node in the right axilla was successful with no apparent post procedure complications.  Pathology indicates benign lymph node. This is concordant with imaging findings.

Ultrasound guided biopsy of the 2.9 cm lymph node in the left axilla was successful with no apparent post procedure complications.  Pathology indicates benign lymph node. This is concordant with imaging findings.

The biopsy results were discussed with the patient.

Due to the size of lymph nodes, the pathologist forwarded the case for formal Hemepath consultation and the pathology report will be addended.

## 2023-05-21 IMAGING — MG MAMMO DIAG BIL
2 series · 2 of 2 positions shown · non-contrast
Comparison: 04/19/2021

INDICATION: Enlarged axillary lymph nodes.

[R AT]
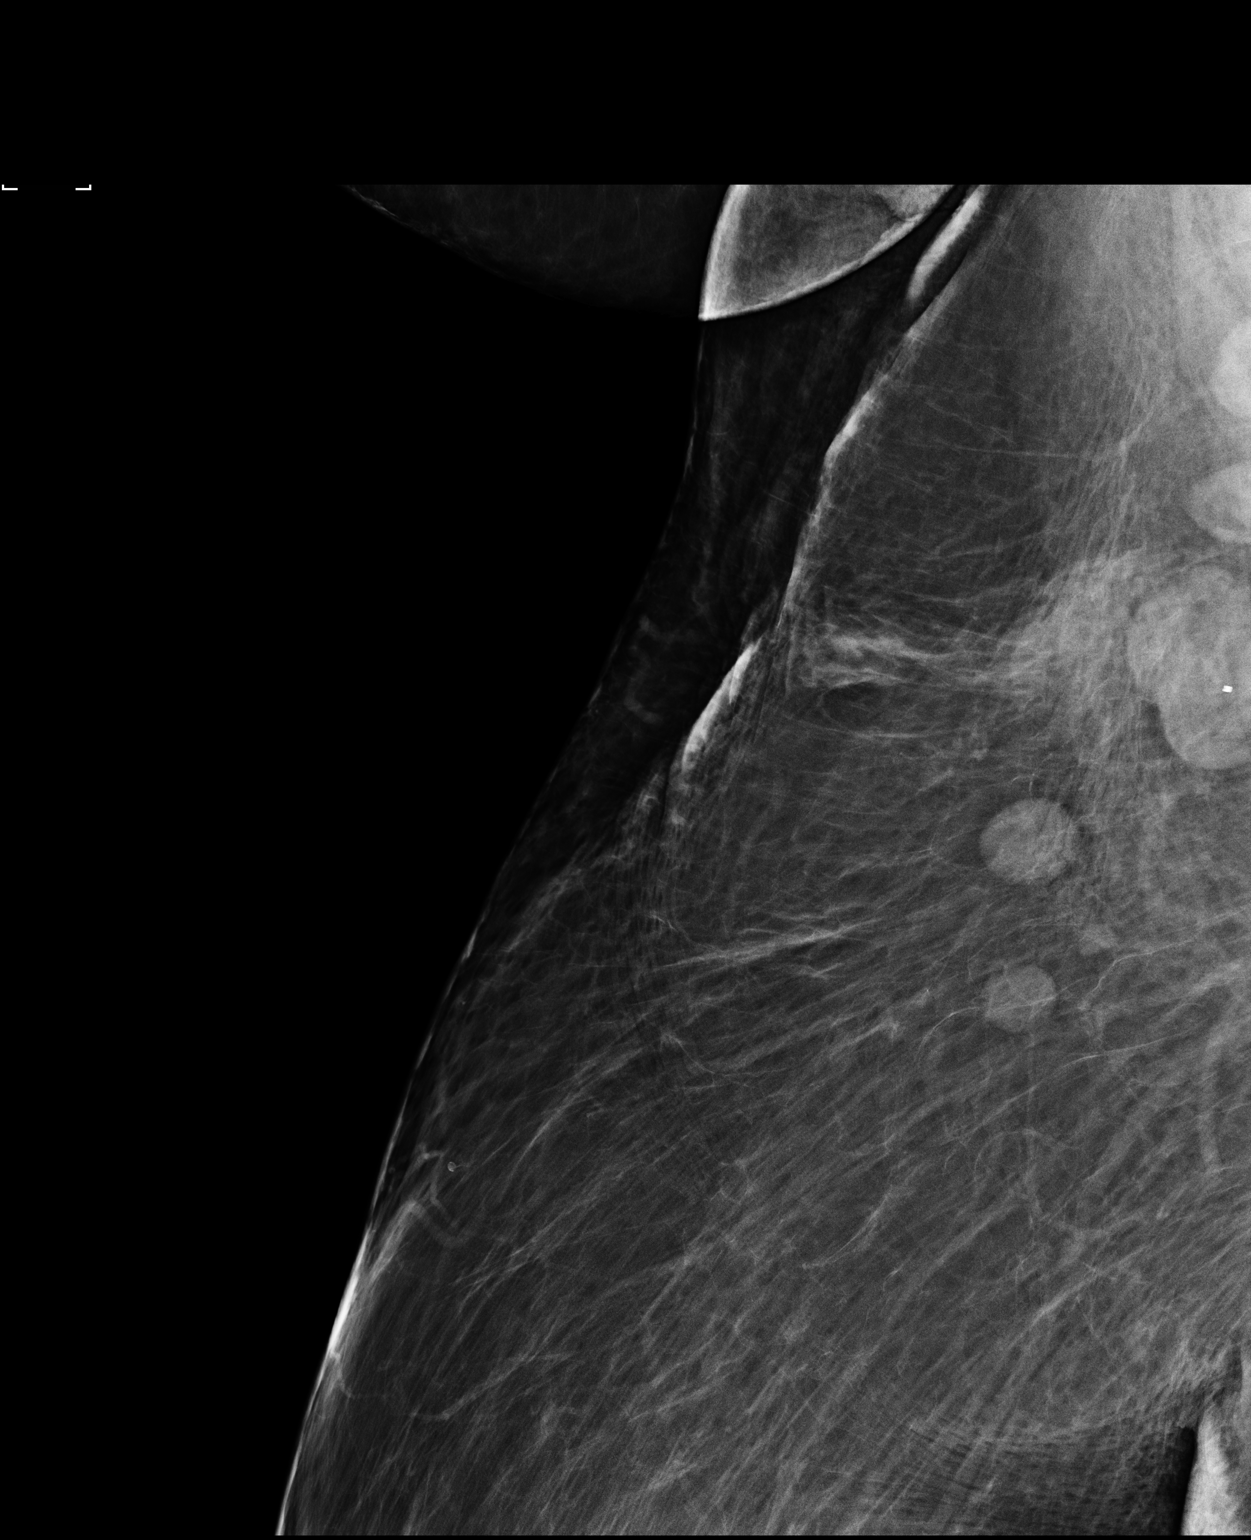

[L AT]
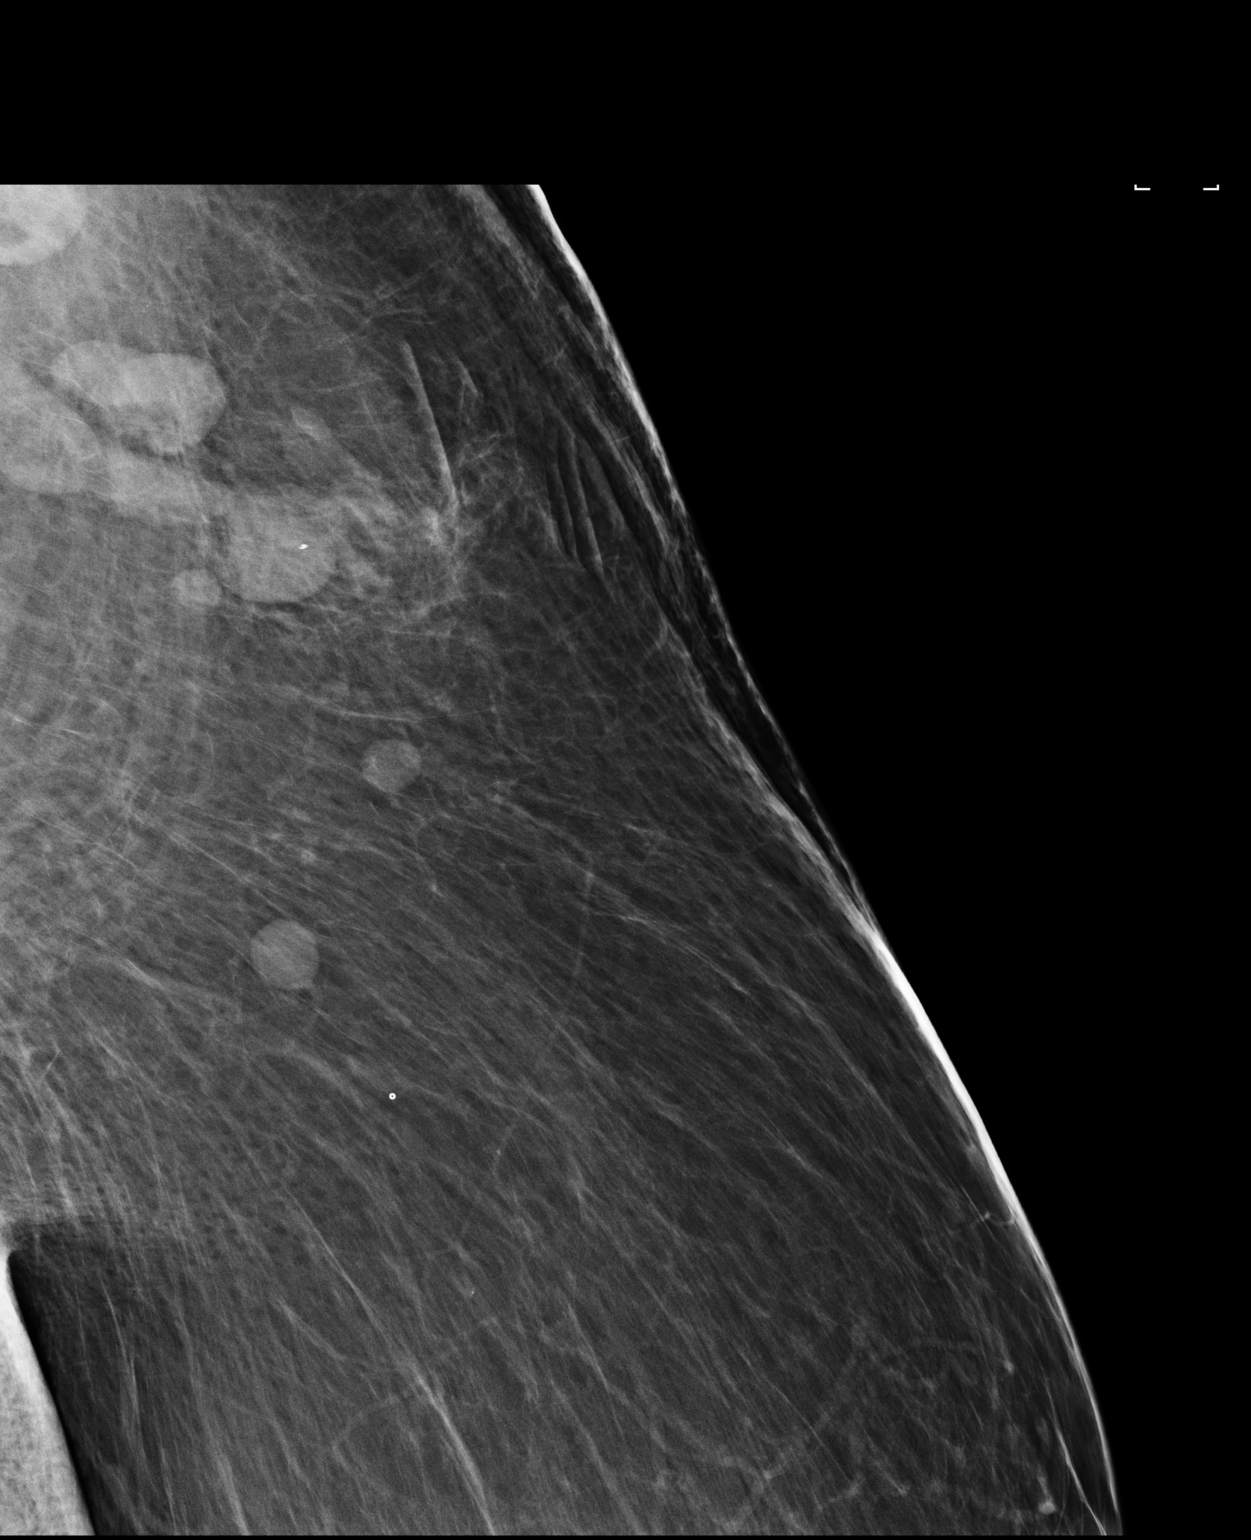

[2 of 2 positions shown; findings below may reference images not displayed]

PROCEDURE:

PATIENT CONSENT: Prior to the procedure risks, complications,alternatives and a description of the procedure were discussed. All questions were answered.  Informed consent was obtained and signed.  

PROCEDURE DESCRIPTION:  

An ultrasound guided biopsy using real-time ultrasound was performed for the 4.4 cm enlarged lymph node in the right axilla.  This was described on the previous ultrasound report.  The skin was prepped in the usual manner.  Local anesthetic was administered to the access site.  A skin nick was made in the breast.  A 18 gauge biopsy needle was placed adjacent to the abnormality under ultrasound guidance.  Once the needle was documented to be in the correct location, three specimens were obtained using the Marquee biopsy needle.  A Hydromark clip was inserted into the biopsy cavity.  A skin adhesive was applied to the access site.  Post procedure digital mammographic imaging demonstrates the clip at the targeted area.  The specimens were sent to the laboratory for pathological analysis.  

An ultrasound guided biopsy using real-time ultrasound was performed for the 2.9 cm lymph node in the left axilla.  This was described on the previous ultrasound report.  The skin was prepped in the usual manner.  Local anesthetic was administered to the access site.  A skin nick was made in the breast.  A 14 gauge biopsy needle was placed adjacent to the abnormality under ultrasound guidance.  Once the needle was documented to be in the correct location, three specimens were obtained using the Marquee biopsy needle.  A Hydromark clip was inserted into the biopsy cavity.  A skin adhesive was applied to the access site.  Post procedure digital mammographic imaging demonstrates the clip at the targeted area.  The specimens were sent to the laboratory for pathological analysis.  

A female technologist was present throughout the procedure.
IMPRESSION: BENIGN

Ultrasound guided biopsy of the 4.4 cm enlarged lymph node in the right axilla was successful with no apparent post procedure complications.  Pathology indicates benign lymph node. This is concordant with imaging findings.

Ultrasound guided biopsy of the 2.9 cm lymph node in the left axilla was successful with no apparent post procedure complications.  Pathology indicates benign lymph node. This is concordant with imaging findings.

The biopsy results were discussed with the patient.

Due to the size of lymph nodes, the pathologist forwarded the case for formal Hemepath consultation and the pathology report will be addended.
